# Patient Record
Sex: Female | Born: 1935
Health system: Southern US, Community
[De-identification: ages and names within clinical notes are randomized; demographics above are authoritative.]

## PROBLEM LIST (undated history)

## (undated) DIAGNOSIS — I2510 Atherosclerotic heart disease of native coronary artery without angina pectoris: Secondary | ICD-10-CM

## (undated) DIAGNOSIS — I10 Essential (primary) hypertension: Secondary | ICD-10-CM

## (undated) DIAGNOSIS — I219 Acute myocardial infarction, unspecified: Secondary | ICD-10-CM

## (undated) DIAGNOSIS — E119 Type 2 diabetes mellitus without complications: Secondary | ICD-10-CM

## (undated) DIAGNOSIS — E785 Hyperlipidemia, unspecified: Secondary | ICD-10-CM

## (undated) HISTORY — DX: Acute myocardial infarction, unspecified: I21.9

## (undated) HISTORY — DX: Type 2 diabetes mellitus without complications: E11.9

## (undated) HISTORY — DX: Essential (primary) hypertension: I10

## (undated) HISTORY — DX: Atherosclerotic heart disease of native coronary artery without angina pectoris: I25.10

## (undated) HISTORY — DX: Hyperlipidemia, unspecified: E78.5

---

## 1986-02-12 HISTORY — PX: APPENDECTOMY: SHX54

## 1988-02-13 HISTORY — PX: CORONARY ARTERY BYPASS GRAFT: SHX141

## 1995-02-13 LAB — HM DEXA SCAN: HM Dexa Scan: NORMAL

## 2010-02-12 HISTORY — PX: EYE SURGERY: SHX253

## 2013-02-12 HISTORY — PX: KYPHOPLASTY: SHX5884

## 2014-07-27 LAB — HEMOGLOBIN A1C: Hemoglobin A1C: 6.6

## 2014-07-27 LAB — LIPID PANEL
Cholesterol: 141 mg/dL (ref 0–200)
HDL: 52 mg/dL (ref 35–70)
LDL Cholesterol: 58 mg/dL

## 2014-07-27 LAB — HEPATIC FUNCTION PANEL
ALT: 15 U/L (ref 7–35)
AST: 17 U/L (ref 13–35)

## 2014-07-27 LAB — BASIC METABOLIC PANEL: CREATININE: 0.7 mg/dL (ref 0.5–1.1)

## 2015-01-25 LAB — HEMOGLOBIN A1C: Hemoglobin A1C: 6.9

## 2015-07-27 LAB — HEPATIC FUNCTION PANEL
ALT: 14 U/L (ref 7–35)
ALT: 14 U/L (ref 7–35)
AST: 14 U/L (ref 13–35)
AST: 14 U/L (ref 13–35)
Alkaline Phosphatase: 73 U/L (ref 25–125)
BILIRUBIN, TOTAL: 0.5 mg/dL

## 2015-07-27 LAB — CBC AND DIFFERENTIAL
HEMATOCRIT: 42 % (ref 36–46)
Hemoglobin: 14.2 g/dL (ref 12.0–16.0)
PLATELETS: 193 10*3/uL (ref 150–399)
WBC: 4.6 10^3/mL
WBC: 7.1 10^3/mL

## 2015-07-27 LAB — LIPID PANEL
CHOLESTEROL: 140 mg/dL (ref 0–200)
HDL: 60 mg/dL (ref 35–70)
LDL Cholesterol: 59 mg/dL
Triglycerides: 103 mg/dL (ref 40–160)

## 2015-07-27 LAB — BASIC METABOLIC PANEL
BUN: 18 mg/dL (ref 4–21)
CREATININE: 0.9 mg/dL (ref 0.5–1.1)
CREATININE: 0.9 mg/dL (ref 0.5–1.1)
GLUCOSE: 194 mg/dL
Potassium: 4.3 mmol/L (ref 3.4–5.3)
Sodium: 139 mmol/L (ref 137–147)

## 2015-07-27 LAB — HEMOGLOBIN A1C: Hemoglobin A1C: 7.2

## 2015-11-17 ENCOUNTER — Encounter: Payer: Self-pay | Admitting: Nurse Practitioner

## 2015-11-17 ENCOUNTER — Encounter: Payer: Self-pay | Admitting: *Deleted

## 2015-11-17 ENCOUNTER — Non-Acute Institutional Stay: Payer: Medicare Other | Admitting: Nurse Practitioner

## 2015-11-17 ENCOUNTER — Other Ambulatory Visit: Payer: Self-pay | Admitting: *Deleted

## 2015-11-17 VITALS — BP 120/82 | HR 79 | Temp 98.5°F | Resp 20 | Ht 65.0 in | Wt 149.6 lb

## 2015-11-17 DIAGNOSIS — I252 Old myocardial infarction: Secondary | ICD-10-CM

## 2015-11-17 DIAGNOSIS — N8110 Cystocele, unspecified: Secondary | ICD-10-CM | POA: Diagnosis not present

## 2015-11-17 DIAGNOSIS — E119 Type 2 diabetes mellitus without complications: Secondary | ICD-10-CM | POA: Insufficient documentation

## 2015-11-17 DIAGNOSIS — E1100 Type 2 diabetes mellitus with hyperosmolarity without nonketotic hyperglycemic-hyperosmolar coma (NKHHC): Secondary | ICD-10-CM

## 2015-11-17 DIAGNOSIS — I10 Essential (primary) hypertension: Secondary | ICD-10-CM | POA: Diagnosis not present

## 2015-11-17 MED ORDER — METFORMIN HCL 500 MG PO TABS
500.0000 mg | ORAL_TABLET | Freq: Two times a day (BID) | ORAL | 2 refills | Status: DC
Start: 2015-11-17 — End: 2016-08-23

## 2015-11-17 MED ORDER — CALCIUM CARBONATE-VITAMIN D3 600-400 MG-UNIT PO TABS
600.0000 mg | ORAL_TABLET | Freq: Two times a day (BID) | ORAL | 2 refills | Status: AC
Start: 2015-11-17 — End: ?

## 2015-11-17 MED ORDER — ATORVASTATIN CALCIUM 20 MG PO TABS: 20.0000 mg | ORAL_TABLET | Freq: Every day | ORAL | 2 refills | Status: DC

## 2015-11-17 MED ORDER — ATENOLOL 50 MG PO TABS: 50.0000 mg | ORAL_TABLET | Freq: Every day | ORAL | 2 refills | Status: DC

## 2015-11-17 MED ORDER — ENALAPRIL MALEATE 20 MG PO TABS
20.0000 mg | ORAL_TABLET | Freq: Two times a day (BID) | ORAL | 2 refills | Status: DC
Start: 2015-11-17 — End: 2016-08-23

## 2015-11-17 MED ORDER — HYDROCHLOROTHIAZIDE 25 MG PO TABS: 25.0000 mg | ORAL_TABLET | Freq: Every day | ORAL | 2 refills | Status: DC

## 2015-11-17 NOTE — Assessment & Plan Note (Addendum)
Hx of Hgb a1c 6-7, Metformin 500mg  bid, Hgb a1c CBC CMP TSH lipid panel.

## 2015-11-17 NOTE — Assessment & Plan Note (Signed)
Cystocele, pessary, Urology.

## 2015-11-17 NOTE — Progress Notes (Signed)
Location:   [facility]   Place of Service:  Clinic (12)   Provider: Marlana Latus NP  Code Status: DNR Goals of Care: [place]  Advanced Directives 11/17/2015  Does patient have an advance directive? Yes  Type of Paramedic of [place];Living will  Does patient want to make changes to advanced directive? No - Patient declined  Copy of advanced directive(s) in chart? Yes     Chief Complaint  Patient presents with  . Establish Care    HPI: Patient is a 82 y.o. female seen today for new patient establishment. Hx of HTN, controlled on Atenolol, Enalapril, HCTZ, cystocele, pessary, needs a Urologist in the area, urinate 1-2x/night, T2DM, controlled on Metformin 500mg  bid, Hgb a1c was 6-7 in the past. Hx of heart attack 28 years ago, no stent or bypass, she was subsequently lost 40Ibs, taking ASA and statin, no further chest pain/pressure since then.   Past Medical History:  Diagnosis Date  . Diabetes mellitus without complication (HCC)    Type 2   . Hyperlipidemia   . Hypertension     Past Surgical History:  Procedure Laterality Date  . APPENDECTOMY  1988   Dr. Collier Salina  . CORONARY ARTERY BYPASS GRAFT  1990  . EYE SURGERY  2012   Dr, Vickki Muff  . KYPHOPLASTY  2015   Dr. Donivan Scull    No Known Allergies    Medication List       Accurate as of 11/17/15  4:04 PM. Always use your most recent med list.          aspirin EC 81 MG tablet Take 81 mg by mouth daily.   atenolol 50 MG tablet Commonly known as:  TENORMIN Take 50 mg by mouth daily.   atorvastatin 20 MG tablet Commonly known as:  LIPITOR Take 20 mg by mouth daily.   Calcium Carbonate-Vitamin D3 600-400 MG-UNIT Tabs Take 600 mg by mouth 2 (two) times daily with a meal.   enalapril 20 MG tablet Commonly known as:  VASOTEC Take 20 mg by mouth 2 (two) times daily.   hydrochlorothiazide 25 MG tablet Commonly known as:  HYDRODIURIL Take 25 mg by mouth daily.   metFORMIN 500 MG tablet Commonly  known as:  GLUCOPHAGE Take 500 mg by mouth 2 (two) times daily with a meal.       Review of Systems:  Review of Systems  Constitutional: Negative for activity change, appetite change, chills, diaphoresis, fever and unexpected weight change.  HENT: Negative for congestion, dental problem, ear discharge, ear pain, hearing loss, mouth sores, rhinorrhea and trouble swallowing.   Eyes: Negative for photophobia, pain, discharge, redness, itching and visual disturbance.  Respiratory: Negative for apnea, cough, choking, chest tightness, shortness of breath and wheezing.   Cardiovascular: Negative for chest pain, palpitations and leg swelling.  Gastrointestinal: Negative for abdominal distention, abdominal pain, anal bleeding, blood in stool, constipation, diarrhea and nausea.  Genitourinary: Positive for frequency. Negative for dysuria, flank pain, genital sores, pelvic pain and urgency.       Pessary  Musculoskeletal: Negative for arthralgias, back pain, gait problem, joint swelling, myalgias, neck pain and neck stiffness.  Skin: Negative for color change, pallor and rash.  Allergic/Immunologic: Negative.   Neurological: Negative for dizziness, tremors, seizures, syncope, facial asymmetry, speech difficulty, light-headedness and numbness.  Hematological: Negative.   Psychiatric/Behavioral: Negative for agitation, behavioral problems, confusion, decreased concentration, dysphoric mood and hallucinations. The patient is not nervous/anxious and is not hyperactive.  Health Maintenance  Topic Date Due  . HEMOGLOBIN A1C  08-29-25  . FOOT EXAM  07[birth year]09/1933  . OPHTHALMOLOGY EXAM  07/09/1935  . TETANUS/TDAP  07/08/1944  . ZOSTAVAX  07/08/1985  . DEXA SCAN  07/09/1990  . PNA vac Low Risk Adult (1 of 2 - PCV13) 07/09/1990  . INFLUENZA VACCINE  09/13/2015    Physical Exam: Vitals:   11/17/15 1514  BP: 120/82  Pulse: 79  Resp: 20  Temp: 98.5 F (36.9 C)  Weight: 149 lb 9.6 oz (67.9 kg)    Height: 5\' 5"  (1.651 m)   Body mass index is 24.89 kg/m. Physical Exam  Constitutional: She is oriented to person, place, and time. She appears well-developed and well-nourished.  HENT:  Head: Normocephalic and atraumatic.  Right Ear: External ear normal.  Left Ear: External ear normal.  Nose: Nose normal.  Mouth/Throat: Oropharynx is clear and moist.  Eyes: Conjunctivae and EOM are normal. Pupils are equal, round, and reactive to light. Right eye exhibits no discharge. Left eye exhibits no discharge.  S/p cataracts surgery R+L, artificial lens  Neck: Normal range of motion. Neck supple. No JVD present. No tracheal deviation present. No thyromegaly present.  Cardiovascular: Normal rate, regular rhythm, normal heart sounds and intact distal pulses.  Exam reveals no friction rub.   No murmur heard. Pulmonary/Chest: Effort normal and breath sounds normal. No stridor. No respiratory distress. She has no wheezes. She has no rales. She exhibits no tenderness.  Abdominal: Soft. Bowel sounds are normal. There is no tenderness. There is no rebound and no guarding.  Genitourinary: Vagina normal. Rectal exam shows guaiac negative stool. No vaginal discharge found.  Genitourinary Comments: pessary  Musculoskeletal: Normal range of motion. She exhibits no edema, tenderness or deformity.  Lymphadenopathy:    She has no cervical adenopathy.  Neurological: She is alert and oriented to person, place, and time. She has normal reflexes. She displays normal reflexes. No cranial nerve deficit. She exhibits normal muscle tone. Coordination normal.  Skin: Skin is warm and dry. No rash noted. No erythema. No pallor.  Psychiatric: She has a normal mood and affect. Her behavior is normal. Judgment and thought content normal.    Labs reviewed: Basic Metabolic Panel: No results for input(s): NA, K, CL, CO2, GLUCOSE, BUN, CREATININE, CALCIUM, MG, PHOS, TSH in the last 8760 hours. Liver Function Tests: No  results for input(s): AST, ALT, ALKPHOS, BILITOT, PROT, ALBUMIN in the last 8760 hours. No results for input(s): LIPASE, AMYLASE in the last 8760 hours. No results for input(s): AMMONIA in the last 8760 hours. CBC: No results for input(s): WBC, NEUTROABS, HGB, HCT, MCV, PLT in the last 8760 hours. Lipid Panel: No results for input(s): CHOL, HDL, LDLCALC, TRIG, CHOLHDL, LDLDIRECT in the last 8760 hours. No results found for: HGBA1C  Procedures since last visit: No results found.  Assessment/Plan Type 2 diabetes mellitus (HCC) Hx of Hgb a1c 6-7, Metformin 500mg  bid, Hgb a1c CBC CMP TSH lipid panel.   HTN (hypertension) Controlled, continue Enalapril 20mg  bid, Atenolol 50mg , HCTZ 25mg   History of heart attack 28 years ago, one artery complete blockage, no stent, no bypass, lost 40Ibs since then, no further chest pain or pressure. Taking ASA and Statin.   Cystocele, unspecified (CODE) Cystocele, pessary, Urology.     Labs/tests ordered:  CBC, CMP, TSH, Hgb A1c, lipid panel.   Next appt:  3 months

## 2015-11-17 NOTE — Assessment & Plan Note (Signed)
28 years ago, one artery complete blockage, no stent, no bypass, lost 40Ibs since then, no further chest pain or pressure. Taking ASA and Statin.

## 2015-11-17 NOTE — Assessment & Plan Note (Signed)
Controlled, continue Enalapril 20mg  bid, Atenolol 50mg , HCTZ 25mg 

## 2016-01-18 DIAGNOSIS — N8111 Cystocele, midline: Secondary | ICD-10-CM | POA: Diagnosis not present

## 2016-02-23 ENCOUNTER — Encounter: Payer: Self-pay | Admitting: Nurse Practitioner

## 2016-02-23 ENCOUNTER — Non-Acute Institutional Stay: Payer: PPO | Admitting: Nurse Practitioner

## 2016-02-23 DIAGNOSIS — E1100 Type 2 diabetes mellitus with hyperosmolarity without nonketotic hyperglycemic-hyperosmolar coma (NKHHC): Secondary | ICD-10-CM | POA: Diagnosis not present

## 2016-02-23 DIAGNOSIS — S32020A Wedge compression fracture of second lumbar vertebra, initial encounter for closed fracture: Secondary | ICD-10-CM | POA: Insufficient documentation

## 2016-02-23 DIAGNOSIS — S32020S Wedge compression fracture of second lumbar vertebra, sequela: Secondary | ICD-10-CM | POA: Diagnosis not present

## 2016-02-23 DIAGNOSIS — I10 Essential (primary) hypertension: Secondary | ICD-10-CM

## 2016-02-23 DIAGNOSIS — I252 Old myocardial infarction: Secondary | ICD-10-CM

## 2016-02-23 DIAGNOSIS — N8110 Cystocele, unspecified: Secondary | ICD-10-CM

## 2016-02-23 DIAGNOSIS — E785 Hyperlipidemia, unspecified: Secondary | ICD-10-CM | POA: Diagnosis not present

## 2016-02-23 NOTE — Assessment & Plan Note (Signed)
07/27/15 wbc 7.1, Hgb 14.2, plt 193, Na 139, K 4.3, Bun 18, creat 0.90, Hgb a1c7.2, LDL 59.

## 2016-02-23 NOTE — Progress Notes (Signed)
Location:   [facility]   Place of Service:  Clinic (12)   Provider: Marlana Latus NP  Code Status: DNR [place] of Care: [place]  [place]ives 11/17/2015  Does Patient Have a Medical Advance Directive? Yes  Type of Paramedic of [place];Living will  Does patient want to make changes to medical advance directive? No - Patient declined  Copy of [place] in Chart? Yes     Chief Complaint  Patient presents with  . Medical Management of Chronic Issues    HPI: Patient is a 83 y.o. female seen today for new patient establishment. Hx of HTN, controlled on Atenolol, Enalapril, HCTZ, cystocele, pessary, needs a Urologist in the area, urinate 1-2x/night, T2DM, controlled on Metformin 500mg  bid, Hgb a1c was 6-7 in the past. Hx of heart attack 28 years ago, no stent or bypass, she was subsequently lost 40Ibs, taking ASA and statin, no further chest pain/pressure since then.   Past Medical History:  Diagnosis Date  . Diabetes mellitus without complication (HCC)    Type 2   . Hyperlipidemia   . Hypertension     Past Surgical History:  Procedure Laterality Date  . APPENDECTOMY  1988   Dr. Collier Salina  . CORONARY ARTERY BYPASS GRAFT  1990  . EYE SURGERY  2012   Dr, Vickki Muff  . KYPHOPLASTY  2015   Dr. Donivan Scull    No Known Allergies  Allergies as of 02/23/2016   No Known Allergies     Medication List       Accurate as of 02/23/16  3:00 PM. Always use your most recent med list.          aspirin EC 81 MG tablet Take 81 mg by mouth daily.   atenolol 50 MG tablet Commonly known as:  TENORMIN Take 1 tablet (50 mg total) by mouth daily.   atorvastatin 20 MG tablet Commonly known as:  LIPITOR Take 1 tablet (20 mg total) by mouth daily.   Calcium Carbonate-Vitamin D3 600-400 MG-UNIT Tabs Take 600 mg by mouth 2 (two) times daily with a meal.   enalapril 20 MG tablet Commonly known as:  VASOTEC Take 1 tablet (20 mg total) by mouth 2 (two) times  daily.   hydrochlorothiazide 25 MG tablet Commonly known as:  HYDRODIURIL Take 1 tablet (25 mg total) by mouth daily.   metFORMIN 500 MG tablet Commonly known as:  GLUCOPHAGE Take 1 tablet (500 mg total) by mouth 2 (two) times daily with a meal.       Review of Systems:  Review of Systems  Constitutional: Negative for activity change, appetite change, chills, diaphoresis, fever and unexpected weight change.  HENT: Negative for congestion, dental problem, ear discharge, ear pain, hearing loss, mouth sores, rhinorrhea and trouble swallowing.   Eyes: Negative for photophobia, pain, discharge, redness, itching and visual disturbance.  Respiratory: Negative for apnea, cough, choking, chest tightness, shortness of breath and wheezing.   Cardiovascular: Negative for chest pain, palpitations and leg swelling.  Gastrointestinal: Negative for abdominal distention, abdominal pain, anal bleeding, blood in stool, constipation, diarrhea and nausea.  Genitourinary: Positive for frequency. Negative for dysuria, flank pain, genital sores, pelvic pain and urgency.       Pessary  Musculoskeletal: Negative for arthralgias, back pain, gait problem, joint swelling, myalgias, neck pain and neck stiffness.  Skin: Negative for color change, pallor and rash.  Allergic/Immunologic: Negative.   Neurological: Negative for dizziness, tremors, seizures, syncope, facial asymmetry, speech difficulty, light-headedness  and numbness.  Hematological: Negative.   Psychiatric/Behavioral: Negative for agitation, behavioral problems, confusion, decreased concentration, dysphoric mood and hallucinations. The patient is not nervous/anxious and is not hyperactive.     Health Maintenance  Topic Date Due  . HEMOGLOBIN A1C  1925/12/19  . FOOT EXAM  07/09[birth year]1935  . OPHTHALMOLOGY EXAM  07/09/1935  . TETANUS/TDAP  07/08/1944  . ZOSTAVAX  07/08/1985  . DEXA SCAN  07/09/1990  . PNA vac Low Risk Adult (1 of 2 - PCV13) 07/09/1990    . INFLUENZA VACCINE  09/13/2015    Physical Exam: Vitals:   02/23/16 1427  BP: 130/80  Pulse: 73  Resp: 18  Temp: 98.6 F (37 C)  TempSrc: Oral  SpO2: 96%  Weight: 151 lb 9.6 oz (68.8 kg)  Height: 5\' 5"  (1.651 m)   Body mass index is 25.23 kg/m. Physical Exam  Constitutional: She is oriented to person, place, and time. She appears well-developed and well-nourished.  HENT:  Head: Normocephalic and atraumatic.  Right Ear: External ear normal.  Left Ear: External ear normal.  Nose: Nose normal.  Mouth/Throat: Oropharynx is clear and moist.  Eyes: Conjunctivae and EOM are normal. Pupils are equal, round, and reactive to light. Right eye exhibits no discharge. Left eye exhibits no discharge.  S/p cataracts surgery R+L, artificial lens  Neck: Normal range of motion. Neck supple. No JVD present. No tracheal deviation present. No thyromegaly present.  Cardiovascular: Normal rate, regular rhythm, normal heart sounds and intact distal pulses.  Exam reveals no friction rub.   No murmur heard. Pulmonary/Chest: Effort normal and breath sounds normal. No stridor. No respiratory distress. She has no wheezes. She has no rales. She exhibits no tenderness.  Abdominal: Soft. Bowel sounds are normal. There is no tenderness. There is no rebound and no guarding.  Genitourinary: Vagina normal. Rectal exam shows guaiac negative stool. No vaginal discharge found.  Genitourinary Comments: pessary  Musculoskeletal: Normal range of motion. She exhibits no edema, tenderness or deformity.  Lymphadenopathy:    She has no cervical adenopathy.  Neurological: She is alert and oriented to person, place, and time. She has normal reflexes. No cranial nerve deficit. She exhibits normal muscle tone. Coordination normal.  Skin: Skin is warm and dry. No rash noted. No erythema. No pallor.  Psychiatric: She has a normal mood and affect. Her behavior is normal. Judgment and thought content normal.    Labs  reviewed: Basic Metabolic Panel: No results for input(s): NA, K, CL, CO2, GLUCOSE, BUN, CREATININE, CALCIUM, MG, PHOS, TSH in the last 8760 hours. Liver Function Tests: No results for input(s): AST, ALT, ALKPHOS, BILITOT, PROT, ALBUMIN in the last 8760 hours. No results for input(s): LIPASE, AMYLASE in the last 8760 hours. No results for input(s): AMMONIA in the last 8760 hours. CBC: No results for input(s): WBC, NEUTROABS, HGB, HCT, MCV, PLT in the last 8760 hours. Lipid Panel: No results for input(s): CHOL, HDL, LDLCALC, TRIG, CHOLHDL, LDLDIRECT in the last 8760 hours. No results found for: HGBA1C  Procedures since last visit: No results found.  Assessment/Plan HTN (hypertension) Controlled, continue Atenolol 50mg  daily, Enalapril 20mg  daily, HCT 25mg  daily. 07/27/15 wbc 7.1, Hgb 14.2, plt 193, Na 139, K 4.3, Bun 18, creat 0.90, Hgb a1c7.2, LDL 59.   Type 2 diabetes mellitus (HCC) Takes Metformin, will update CBC CMP TSH Hgb a1c lipid panel  History of heart attack 07/27/15 wbc 7.1, Hgb 14.2, plt 193, Na 139, K 4.3, Bun 18, creat 0.90, Hgb a1c7.2, LDL 59.  Cystocele, unspecified (CODE) Midline, pessary, urology q 6 months, last month was last visit, urinary leakage.   Compression fracture of L2 (HCC) Hx, s/p fixation kyphoplasty  Hyperlipidemia Taking Atorvastatin 20mg      Labs/tests ordered:  CBC, CMP, TSH, Hgb A1c, lipid panel prior to next appoint  Next appt:  6 months.

## 2016-02-23 NOTE — Assessment & Plan Note (Signed)
Taking Atorvastatin 20mg 

## 2016-02-23 NOTE — Assessment & Plan Note (Signed)
Takes Metformin, will update CBC CMP TSH Hgb a1c lipid panel

## 2016-02-23 NOTE — Assessment & Plan Note (Signed)
Hx, s/p fixation kyphoplasty

## 2016-02-23 NOTE — Assessment & Plan Note (Signed)
Controlled, continue Atenolol 50mg  daily, Enalapril 20mg  daily, HCT 25mg  daily. 07/27/15 wbc 7.1, Hgb 14.2, plt 193, Na 139, K 4.3, Bun 18, creat 0.90, Hgb a1c7.2, LDL 59.

## 2016-02-23 NOTE — Assessment & Plan Note (Addendum)
Midline, pessary, urology q 6 months, last month was last visit, urinary leakage.

## 2016-02-27 ENCOUNTER — Other Ambulatory Visit: Payer: Self-pay | Admitting: *Deleted

## 2016-02-27 DIAGNOSIS — I10 Essential (primary) hypertension: Secondary | ICD-10-CM

## 2016-02-27 DIAGNOSIS — E119 Type 2 diabetes mellitus without complications: Secondary | ICD-10-CM

## 2016-02-27 DIAGNOSIS — E785 Hyperlipidemia, unspecified: Secondary | ICD-10-CM

## 2016-05-04 DIAGNOSIS — E119 Type 2 diabetes mellitus without complications: Secondary | ICD-10-CM | POA: Diagnosis not present

## 2016-05-31 ENCOUNTER — Encounter: Payer: Self-pay | Admitting: Internal Medicine

## 2016-07-23 DIAGNOSIS — N8111 Cystocele, midline: Secondary | ICD-10-CM | POA: Diagnosis not present

## 2016-07-23 DIAGNOSIS — N952 Postmenopausal atrophic vaginitis: Secondary | ICD-10-CM | POA: Diagnosis not present

## 2016-08-16 ENCOUNTER — Other Ambulatory Visit: Payer: PPO

## 2016-08-16 DIAGNOSIS — E119 Type 2 diabetes mellitus without complications: Secondary | ICD-10-CM | POA: Diagnosis not present

## 2016-08-16 DIAGNOSIS — I10 Essential (primary) hypertension: Secondary | ICD-10-CM | POA: Diagnosis not present

## 2016-08-16 DIAGNOSIS — I2510 Atherosclerotic heart disease of native coronary artery without angina pectoris: Secondary | ICD-10-CM | POA: Diagnosis not present

## 2016-08-16 DIAGNOSIS — E785 Hyperlipidemia, unspecified: Secondary | ICD-10-CM | POA: Diagnosis not present

## 2016-08-23 ENCOUNTER — Non-Acute Institutional Stay: Payer: PPO | Admitting: Nurse Practitioner

## 2016-08-23 ENCOUNTER — Encounter: Payer: Self-pay | Admitting: Nurse Practitioner

## 2016-08-23 DIAGNOSIS — E1100 Type 2 diabetes mellitus with hyperosmolarity without nonketotic hyperglycemic-hyperosmolar coma (NKHHC): Secondary | ICD-10-CM

## 2016-08-23 DIAGNOSIS — N8110 Cystocele, unspecified: Secondary | ICD-10-CM

## 2016-08-23 DIAGNOSIS — E785 Hyperlipidemia, unspecified: Secondary | ICD-10-CM

## 2016-08-23 DIAGNOSIS — I252 Old myocardial infarction: Secondary | ICD-10-CM | POA: Diagnosis not present

## 2016-08-23 DIAGNOSIS — I10 Essential (primary) hypertension: Secondary | ICD-10-CM | POA: Diagnosis not present

## 2016-08-23 NOTE — Progress Notes (Signed)
Location:   [facility]   Place of Service:  Clinic (12)   Provider: Marlana Latus NP  Code Status: DNR [place]als of Care: [place]  [place] Directives 08/23/2016  Does Patient Have a Medical Advance Directive? Yes  Type of Advance Directive [place]  Does patient want to make changes to medical advance directive? No - Patient declined  Copy of [place] in Chart? -     Chief Complaint  Patient presents with  . Medical Management of Chronic Issues    6 mo f/u with labs    HPI: Patient is a 83 y.o. female seen today for managing her chronic medical condisitons.   Hx of HTN, controlled on Atenolol, Enalapril, HCTZ, cystocele, pessary, needs a Urologist in the area, urinate 1-2x/night, T2DM, controlled on Metformin 500mg  bid, Hgb a1c was 6-7 in the past. Hx of heart attack 30 years ago, no stent or bypass, she was subsequently lost 40Ibs, taking ASA and statin, no further chest pain/pressure since then, weight has been stable.   Past Medical History:  Diagnosis Date  . Coronary arteriosclerosis   . Diabetes mellitus without complication (HCC)    Type 2   . Hyperlipidemia   . Hypertension   . Myocardial infarction [facility])     Past Surgical History:  Procedure Laterality Date  . APPENDECTOMY  1988   Dr. Collier Salina  . CORONARY ARTERY BYPASS GRAFT  1990  . EYE SURGERY  2012   Dr, Vickki Muff  . KYPHOPLASTY  2015   Dr. Donivan Scull    No Known Allergies  Allergies as of 08/23/2016   No Known Allergies     Medication List       Accurate as of 08/23/16  2:11 PM. Always use your most recent med list.          aspirin EC 81 MG tablet Take 81 mg by mouth daily.   atenolol 50 MG tablet Commonly known as:  TENORMIN Take 1 tablet (50 mg total) by mouth daily.   atorvastatin 20 MG tablet Commonly known as:  LIPITOR Take 1 tablet (20 mg total) by mouth daily.   Calcium Carbonate-Vitamin D3 600-400 MG-UNIT Tabs Take 600 mg by mouth 2 (two) times daily with a  meal.   enalapril 20 MG tablet Commonly known as:  VASOTEC Take 1 tablet (20 mg total) by mouth 2 (two) times daily.   hydrochlorothiazide 25 MG tablet Commonly known as:  HYDRODIURIL Take 1 tablet (25 mg total) by mouth daily.   metFORMIN 500 MG tablet Commonly known as:  GLUCOPHAGE Take 1 tablet (500 mg total) by mouth 2 (two) times daily with a meal.       Review of Systems:  Review of Systems  Constitutional: Negative for activity change, appetite change, chills, diaphoresis, fever and unexpected weight change.  HENT: Negative for congestion, dental problem, ear discharge, ear pain, hearing loss, mouth sores, rhinorrhea and trouble swallowing.   Eyes: Negative for photophobia, pain, discharge, redness, itching and visual disturbance.  Respiratory: Negative for apnea, cough, choking, chest tightness, shortness of breath and wheezing.   Cardiovascular: Negative for chest pain, palpitations and leg swelling.  Gastrointestinal: Negative for abdominal distention, abdominal pain, anal bleeding, blood in stool, constipation, diarrhea and nausea.  Genitourinary: Positive for frequency. Negative for dysuria, flank pain, genital sores, pelvic pain and urgency.       Pessary, urination 1-2x/night.   Musculoskeletal: Negative for arthralgias, back pain, gait problem, joint swelling, myalgias, neck pain  and neck stiffness.  Skin: Negative for color change, pallor and rash.  Allergic/Immunologic: Negative.   Neurological: Negative for dizziness, tremors, seizures, syncope, facial asymmetry, speech difficulty, light-headedness and numbness.  Hematological: Negative.   Psychiatric/Behavioral: Negative for agitation, behavioral problems, confusion, decreased concentration, dysphoric mood and hallucinations. The patient is not nervous/anxious and is not hyperactive.        Sometimes not returning to sleep after bathroom trips, but she felt rested anyway    Health Maintenance  Topic Date Due    . FOOT EXAM  [birth year]09/1933  . OPHTHALMOLOGY EXAM  07/09/1935  . TETANUS/TDAP  07/08/1944  . DEXA SCAN  07/09/1990  . PNA vac Low Risk Adult (1 of 2 - PCV13) 07/09/1990  . HEMOGLOBIN A1C  01/26/2016  . INFLUENZA VACCINE  09/12/2016    Physical Exam: Vitals:   08/23/16 1333  BP: 130/82  Pulse: 74  Resp: 20  Temp: 98.2 F (36.8 C)  SpO2: 95%  Weight: 153 lb 6.4 oz (69.6 kg)  Height: 5\' 5"  (1.651 m)   Body mass index is 25.53 kg/m. Physical Exam  Constitutional: She is oriented to person, place, and time. She appears well-developed and well-nourished.  HENT:  Head: Normocephalic and atraumatic.  Right Ear: External ear normal.  Left Ear: External ear normal.  Nose: Nose normal.  Mouth/Throat: Oropharynx is clear and moist.  Eyes: Pupils are equal, round, and reactive to light. Conjunctivae and EOM are normal. Right eye exhibits no discharge. Left eye exhibits no discharge.  S/p cataracts surgery R+L, artificial lens  Neck: Normal range of motion. Neck supple. No JVD present. No tracheal deviation present. No thyromegaly present.  Cardiovascular: Normal rate, regular rhythm, normal heart sounds and intact distal pulses.  Exam reveals no friction rub.   No murmur heard. Pulmonary/Chest: Effort normal and breath sounds normal. No stridor. No respiratory distress. She has no wheezes. She has no rales. She exhibits no tenderness.  Abdominal: Soft. Bowel sounds are normal. There is no tenderness. There is no rebound and no guarding.  Genitourinary: Vagina normal. Rectal exam shows guaiac negative stool. No vaginal discharge found.  Genitourinary Comments: pessary  Musculoskeletal: Normal range of motion. She exhibits no edema, tenderness or deformity.  Lymphadenopathy:    She has no cervical adenopathy.  Neurological: She is alert and oriented to person, place, and time. She has normal reflexes. No cranial nerve deficit. She exhibits normal muscle tone. Coordination normal.  Skin:  Skin is warm and dry. No rash noted. No erythema. No pallor.  Psychiatric: She has a normal mood and affect. Her behavior is normal. Judgment and thought content normal.    Labs reviewed: Basic Metabolic Panel: No results for input(s): NA, K, CL, CO2, GLUCOSE, BUN, CREATININE, CALCIUM, MG, PHOS, TSH in the last 8760 hours. Liver Function Tests: No results for input(s): AST, ALT, ALKPHOS, BILITOT, PROT, ALBUMIN in the last 8760 hours. No results for input(s): LIPASE, AMYLASE in the last 8760 hours. No results for input(s): AMMONIA in the last 8760 hours. CBC: No results for input(s): WBC, NEUTROABS, HGB, HCT, MCV, PLT in the last 8760 hours. Lipid Panel: No results for input(s): CHOL, HDL, LDLCALC, TRIG, CHOLHDL, LDLDIRECT in the last 8760 hours. Lab Results  Component Value Date   HGBA1C 7.2 07/27/2015    Procedures since last visit: No results found.  Assessment/Plan Type 2 diabetes mellitus (HCC) Denied tingling, numbness, or pain in BLE/feet, 08/17/16 Hgb a1c 6.8(7.2 07/26/16), cholesterol 123, HDL 57, LDL 46 08/23/16 continue Metformin  500mg  bid po, Atorvastatin 20mg  qd  HTN (hypertension) Controlled, continue Atenolol 50mg  daily, Enalapril 20mg  daily, HCT 25mg  daily, ASA 81mg  qd  Hyperlipidemia Stable, LDL 46 08/17/16, continue Atorvastatin 20mg  qd  Cystocele, unspecified (CODE) Continue Pessary, 1-2x urination/night  History of heart attack No angina since last seen, continue Atorvastatin,  ASA, Atenolol, Enalapril.     Labs/tests ordered: CBC CMP Hgb a1c prior to next appointment.   Next appt:  1:30 pm, 02/28/2017

## 2016-08-23 NOTE — Assessment & Plan Note (Addendum)
Denied tingling, numbness, or pain in BLE/feet, 08/17/16 Hgb a1c 6.8(7.2 07/26/16), cholesterol 123, HDL 57, LDL 46 08/23/16 continue Metformin 500mg  bid po, Atorvastatin 20mg  qd

## 2016-08-23 NOTE — Assessment & Plan Note (Signed)
Stable, LDL 46 08/17/16, continue Atorvastatin 20mg  qd

## 2016-08-23 NOTE — Assessment & Plan Note (Signed)
Continue Pessary, 1-2x urination/night

## 2016-08-23 NOTE — Patient Instructions (Signed)
CBC CMP Hgb a1c prior to next appointment. Next appt:  1:30 pm, 02/28/2017

## 2016-08-23 NOTE — Assessment & Plan Note (Signed)
No angina since last seen, continue Atorvastatin,  ASA, Atenolol, Enalapril.

## 2016-08-23 NOTE — Assessment & Plan Note (Addendum)
Controlled, continue Atenolol 50mg  daily, Enalapril 20mg  daily, HCT 25mg  daily, ASA 81mg  qd

## 2016-08-27 MED ORDER — ENALAPRIL MALEATE 20 MG PO TABS: 20.0000 mg | ORAL_TABLET | Freq: Two times a day (BID) | ORAL | 2 refills | Status: DC

## 2016-08-27 MED ORDER — ATORVASTATIN CALCIUM 20 MG PO TABS: 20.0000 mg | ORAL_TABLET | Freq: Every day | ORAL | 2 refills | Status: DC

## 2016-08-27 MED ORDER — METFORMIN HCL 500 MG PO TABS: 500.0000 mg | ORAL_TABLET | Freq: Two times a day (BID) | ORAL | 2 refills | Status: DC

## 2016-08-27 MED ORDER — ATENOLOL 50 MG PO TABS: 50.0000 mg | ORAL_TABLET | Freq: Every day | ORAL | 2 refills | Status: DC

## 2016-08-27 MED ORDER — HYDROCHLOROTHIAZIDE 25 MG PO TABS: 25.0000 mg | ORAL_TABLET | Freq: Every day | ORAL | 2 refills | Status: DC

## 2016-12-28 ENCOUNTER — Telehealth: Payer: Self-pay | Admitting: Internal Medicine

## 2016-12-28 NOTE — Telephone Encounter (Signed)
I called the patient to schedule AWV-I at [facility] clinic on 01/11/17.  Pt will not be there on that date, so I will call her back once I know of another date Clarise Cruz will be at [facility]. Pt declined coming to [facility] office on [facility]. for appt. VDM (DD)

## 2017-01-10 ENCOUNTER — Non-Acute Institutional Stay: Payer: PPO

## 2017-01-10 VITALS — BP 135/70 | HR 70 | Temp 98.9°F | Ht 65.0 in | Wt 156.0 lb

## 2017-01-10 DIAGNOSIS — Z0000 Encounter for general adult medical examination without abnormal findings: Secondary | ICD-10-CM

## 2017-01-10 NOTE — Progress Notes (Signed)
Subjective:   Jessica Walters is a 83 y.o. female who presents for an Initial Medicare Annual Wellness Visit at [facility]       Objective:    Today's Vitals   01/10/17 1103  BP: 135/70  Pulse: 70  Temp: 98.9 F (37.2 C)  TempSrc: Oral  SpO2: 95%  Weight: 156 lb (70.8 kg)  Height: 5\' 5"  (1.651 m)   Body mass index is 25.96 kg/m.   Current Medications (verified) Outpatient Encounter Medications as of 01/10/2017  Medication Sig  . aspirin EC 81 MG tablet Take 81 mg by mouth daily.  Marland Kitchen atenolol (TENORMIN) 50 MG tablet Take 1 tablet (50 mg total) by mouth daily.  Marland Kitchen atorvastatin (LIPITOR) 20 MG tablet Take 1 tablet (20 mg total) by mouth daily.  . Calcium Carbonate-Vitamin D3 600-400 MG-UNIT TABS Take 600 mg by mouth 2 (two) times daily with a meal.  . enalapril (VASOTEC) 20 MG tablet Take 1 tablet (20 mg total) by mouth 2 (two) times daily.  . hydrochlorothiazide (HYDRODIURIL) 25 MG tablet Take 1 tablet (25 mg total) by mouth daily.  . metFORMIN (GLUCOPHAGE) 500 MG tablet Take 1 tablet (500 mg total) by mouth 2 (two) times daily with a meal.   No facility-administered encounter medications on file as of 01/10/2017.     Allergies (verified) Patient has no known allergies.   History: Past Medical History:  Diagnosis Date  . Coronary arteriosclerosis   . Diabetes mellitus without complication (HCC)    Type 2   . Hyperlipidemia   . Hypertension   . Myocardial infarction [facility])    Past Surgical History:  Procedure Laterality Date  . APPENDECTOMY  1988   Dr. Collier Salina  . CORONARY ARTERY BYPASS GRAFT  1990  . EYE SURGERY  2012   Dr, Vickki Muff  . KYPHOPLASTY  2015   Dr. Donivan Scull   Family History  Problem Relation Age of Onset  . Hypertension Mother   . Lung cancer Father    Social History   Occupational History  . Not on file  Tobacco Use  . Smoking status: Never Smoker  . Smokeless tobacco: Never Used  Substance and Sexual Activity    . Alcohol use: Yes    Comment: 2-3 weekly  . Drug use: Not on file  . Sexual activity: Not on file    Tobacco Counseling Counseling given: Not Answered   Activities of Daily Living In your present state of health, do you have any difficulty performing the following activities: 01/10/2017  Hearing? N  Vision? N  Difficulty concentrating or making decisions? N  Walking or climbing stairs? N  Dressing or bathing? N  Doing errands, shopping? N  Preparing Food and eating ? N  Using the Toilet? N  In the past six months, have you accidently leaked urine? Y  Comment pessary  Do you have problems with loss of bowel control? N  Managing your Medications? N  Managing your Finances? N  Housekeeping or managing your Housekeeping? N  Some recent data might be hidden    Immunizations and Health Maintenance Immunization History  Administered Date(s) Administered  . Influenza, Seasonal, Injecte, Preservative Fre 12/27/2010, 12/21/2011  . Influenza,inj,Quad PF,6+ Mos 01/14/2013, 12/01/2013, 01/25/2015  . Pneumococcal Conjugate-13 07/27/2014  . Pneumococcal Polysaccharide-23 11/07/2004   Health Maintenance Due  Topic Date Due  . FOOT EXAM  07/09/1933  . OPHTHALMOLOGY EXAM  07/09/1935  . TETANUS/TDAP  07/08/1944  . DEXA SCAN  07/09/1990  .  HEMOGLOBIN A1C  01/26/2016  . INFLUENZA VACCINE  09/12/2016    Patient Care Team: Blanchie Serve, MD as PCP - General (Internal Medicine) Mast, Man X, NP as Nurse Practitioner (Internal Medicine)  Indicate any recent Medical Services you may have received from other than [facility] providers in the past year (date may be approximate).     Assessment:   This is a routine wellness examination for Jessica Walters.   Hearing/Vision screen Hearing Screening Comments: Pt reports no issues Vision Screening Comments: Goes to [facility]  Dietary issues and exercise activities discussed: Current Exercise Habits: Structured exercise class,  Type of exercise: Other - see comments(fhg classes), Time (Minutes): 30, Frequency (Times/Week): 3, Weekly Exercise (Minutes/Week): 90, Exercise limited by: None identified  Goals    . Maintain Lifestyle     Starting today pt will maintain lifestyle.       Depression Screen PHQ 2/9 Scores 01/10/2017  PHQ - 2 Score 0    Fall Risk Fall Risk  01/10/2017  Falls in the past year? No    Cognitive Function: MMSE - Mini Mental State Exam 01/10/2017  Orientation to time 5  Orientation to Place 5  Registration 3  Attention/ Calculation 5  Recall 2  Language- name 2 objects 2  Language- repeat 1  Language- follow 3 step command 3  Language- read & follow direction 1  Write a sentence 1  Copy design 1  Total score 29        Screening Tests Health Maintenance  Topic Date Due  . FOOT EXAM  07[birth year]09/1933  . OPHTHALMOLOGY EXAM  07/09/1935  . TETANUS/TDAP  07/08/1944  . DEXA SCAN  07/09/1990  . HEMOGLOBIN A1C  01/26/2016  . INFLUENZA VACCINE  09/12/2016  . PNA vac Low Risk Adult  Completed      Plan:    I have personally reviewed and addressed the Medicare Annual Wellness questionnaire and have noted the following in the patient's chart:  A. Medical and social history B. Use of alcohol, tobacco or illicit drugs  C. Current medications and supplements D. Functional ability and status E.  Nutritional status F.  Physical activity G. Advance directives H. List of other physicians I.  Hospitalizations, surgeries, and ER visits in previous 12 months J.  [place] to include hearing, vision, cognitive, depression L. Referrals and appointments - none  In addition, I have reviewed and discussed with patient certain preventive protocols, quality metrics, and best practice recommendations. A written personalized care plan for preventive services as well as general preventive health recommendations were provided to patient.  See attached scanned questionnaire for  additional information.   Signed,   Rich Reining, RN Nurse Health Advisor   Quick Notes   Health Maintenance: Shingrix prescription sent to pharamcy. Pt declined TDAP. Pt will make eye appointment. Foot exam and HgA1c due     Abnormal Screen: MMSE 29/30.      Patient Concerns: None     Nurse Concerns: None

## 2017-01-10 NOTE — Patient Instructions (Signed)
Jessica Walters , Thank you for taking time to come for your Medicare Wellness Visit. I appreciate your ongoing commitment to your health goals. Please review the following plan we discussed and let me know if I can assist you in the future.   Screening recommendations/referrals: Colonoscopy excluded, you are over age 81 Mammogram excluded, you are over age 37 Bone Density up to date Recommended yearly ophthalmology/optometry visit for glaucoma screening and checkup Recommended yearly dental visit for hygiene and checkup  Vaccinations: Influenza vaccine up to date. Due 2019 fall season Pneumococcal vaccine up to date Tdap vaccine due, declined Shingles vaccine due, prescription sent to pharmacy    Advanced directives: In Chart  Conditions/risks identified: None  Next appointment: Mast NP 02/28/2017 @ 1:30pm   Preventive Care 65 Years and Older, Female Preventive care refers to lifestyle choices and visits with your health care provider that can promote health and wellness. What does preventive care include?  A yearly physical exam. This is also called an annual well check.  Dental exams once or twice a year.  Routine eye exams. Ask your health care provider how often you should have your eyes checked.  Personal lifestyle choices, including:  Daily care of your teeth and gums.  Regular physical activity.  Eating a healthy diet.  Avoiding tobacco and drug use.  Limiting alcohol use.  Practicing safe sex.  Taking low-dose aspirin every day.  Taking vitamin and mineral supplements as recommended by your health care provider. What happens during an annual well check? The services and screenings done by your health care provider during your annual well check will depend on your age, overall health, lifestyle risk factors, and family history of disease. Counseling  Your health care provider may ask you questions about your:  Alcohol use.  Tobacco use.  Drug  use.  Emotional well-being.  Home and relationship well-being.  Sexual activity.  Eating habits.  History of falls.  Memory and ability to understand (cognition).  Work and work Statistician.  Reproductive health. Screening  You may have the following tests or measurements:  Height, weight, and BMI.  Blood pressure.  Lipid and cholesterol levels. These may be checked every 5 years, or more frequently if you are over 65 years old.  Skin check.  Lung cancer screening. You may have this screening every year starting at age 69 if you have a 30-pack-year history of smoking and currently smoke or have quit within the past 15 years.  Fecal occult blood test (FOBT) of the stool. You may have this test every year starting at age 29.  Flexible sigmoidoscopy or colonoscopy. You may have a sigmoidoscopy every 5 years or a colonoscopy every 10 years starting at age 62.  Hepatitis C blood test.  Hepatitis B blood test.  Sexually transmitted disease (STD) testing.  Diabetes screening. This is done by checking your blood sugar (glucose) after you have not eaten for a while (fasting). You may have this done every 1-3 years.  Bone density scan. This is done to screen for osteoporosis. You may have this done starting at age 19.  Mammogram. This may be done every 1-2 years. Talk to your health care provider about how often you should have regular mammograms. Talk with your health care provider about your test results, treatment options, and if necessary, the need for more tests. Vaccines  Your health care provider may recommend certain vaccines, such as:  Influenza vaccine. This is recommended every year.  Tetanus, diphtheria, and acellular  pertussis (Tdap, Td) vaccine. You may need a Td booster every 10 years.  Zoster vaccine. You may need this after age 90.  Pneumococcal 13-valent conjugate (PCV13) vaccine. One dose is recommended after age 84.  Pneumococcal polysaccharide  (PPSV23) vaccine. One dose is recommended after age 70. Talk to your health care provider about which screenings and vaccines you need and how often you need them. This information is not intended to replace advice given to you by your health care provider. Make sure you discuss any questions you have with your health care provider. Document Released: 02/25/2015 Document Revised: 10/19/2015 Document Reviewed: 11/30/2014 Elsevier Interactive Patient Education  2017 [place] Prevention in the Home Falls can cause injuries. They can happen to people of all ages. There are many things you can do to make your home safe and to help prevent falls. What can I do on the outside of my home?  Regularly fix the edges of walkways and driveways and fix any cracks.  Remove anything that might make you trip as you walk through a door, such as a raised step or threshold.  Trim any bushes or trees on the path to your home.  Use bright outdoor lighting.  Clear any walking paths of anything that might make someone trip, such as rocks or tools.  Regularly check to see if handrails are loose or broken. Make sure that both sides of any steps have handrails.  Any raised decks and porches should have guardrails on the edges.  Have any leaves, snow, or ice cleared regularly.  Use sand or salt on walking paths during winter.  Clean up any spills in your garage right away. This includes oil or grease spills. What can I do in the bathroom?  Use night lights.  Install grab bars by the toilet and in the tub and shower. Do not use towel bars as grab bars.  Use non-skid mats or decals in the tub or shower.  If you need to sit down in the shower, use a plastic, non-slip stool.  Keep the floor dry. Clean up any water that spills on the floor as soon as it happens.  Remove soap buildup in the tub or shower regularly.  Attach bath mats securely with double-sided non-slip rug tape.  Do not have  throw rugs and other things on the floor that can make you trip. What can I do in the bedroom?  Use night lights.  Make sure that you have a light by your bed that is easy to reach.  Do not use any sheets or blankets that are too big for your bed. They should not hang down onto the floor.  Have a firm chair that has side arms. You can use this for support while you get dressed.  Do not have throw rugs and other things on the floor that can make you trip. What can I do in the kitchen?  Clean up any spills right away.  Avoid walking on wet floors.  Keep items that you use a lot in easy-to-reach places.  If you need to reach something above you, use a strong step stool that has a grab bar.  Keep electrical cords out of the way.  Do not use floor polish or wax that makes floors slippery. If you must use wax, use non-skid floor wax.  Do not have throw rugs and other things on the floor that can make you trip. What can I do with my stairs?  Do not leave any items on the stairs.  Make sure that there are handrails on both sides of the stairs and use them. Fix handrails that are broken or loose. Make sure that handrails are as long as the stairways.  Check any carpeting to make sure that it is firmly attached to the stairs. Fix any carpet that is loose or worn.  Avoid having throw rugs at the top or bottom of the stairs. If you do have throw rugs, attach them to the floor with carpet tape.  Make sure that you have a light switch at the top of the stairs and the bottom of the stairs. If you do not have them, ask someone to add them for you. What else can I do to help prevent falls?  Wear shoes that:  Do not have high heels.  Have rubber bottoms.  Are comfortable and fit you well.  Are closed at the toe. Do not wear sandals.  If you use a stepladder:  Make sure that it is fully opened. Do not climb a closed stepladder.  Make sure that both sides of the stepladder are  locked into place.  Ask someone to hold it for you, if possible.  Clearly mark and make sure that you can see:  Any grab bars or handrails.  First and last steps.  Where the edge of each step is.  Use tools that help you move around (mobility aids) if they are needed. These include:  Canes.  Walkers.  Scooters.  Crutches.  Turn on the lights when you go into a dark area. Replace any light bulbs as soon as they burn out.  Set up your furniture so you have a clear path. Avoid moving your furniture around.  If any of your floors are uneven, fix them.  If there are any pets around you, be aware of where they are.  Review your medicines with your doctor. Some medicines can make you feel dizzy. This can increase your chance of falling. Ask your doctor what other things that you can do to help prevent falls. This information is not intended to replace advice given to you by your health care provider. Make sure you discuss any questions you have with your health care provider. Document Released: 11/25/2008 Document Revised: 07/07/2015 Document Reviewed: 03/05/2014 Elsevier Interactive Patient Education  2017 [employer].

## 2017-02-25 NOTE — Addendum Note (Signed)
Addended by: Eilene Ghazi on: 02/25/2017 09:33 AM   Modules accepted: Orders

## 2017-02-26 ENCOUNTER — Other Ambulatory Visit: Payer: PPO

## 2017-02-26 DIAGNOSIS — E1100 Type 2 diabetes mellitus with hyperosmolarity without nonketotic hyperglycemic-hyperosmolar coma (NKHHC): Secondary | ICD-10-CM | POA: Diagnosis not present

## 2017-02-26 DIAGNOSIS — I10 Essential (primary) hypertension: Secondary | ICD-10-CM | POA: Diagnosis not present

## 2017-02-27 LAB — COMPLETE METABOLIC PANEL WITH GFR
AG RATIO: 2.1 (calc) (ref 1.0–2.5)
ALBUMIN MSPROF: 4.1 g/dL (ref 3.6–5.1)
ALKALINE PHOSPHATASE (APISO): 60 U/L (ref 33–130)
ALT: 11 U/L (ref 6–29)
AST: 12 U/L (ref 10–35)
BUN / CREAT RATIO: 28 (calc) — AB (ref 6–22)
BUN: 26 mg/dL — ABNORMAL HIGH (ref 7–25)
CO2: 32 mmol/L (ref 20–32)
CREATININE: 0.92 mg/dL — AB (ref 0.60–0.88)
Calcium: 9.7 mg/dL (ref 8.6–10.4)
Chloride: 103 mmol/L (ref 98–110)
GFR, EST NON AFRICAN AMERICAN: 54 mL/min/{1.73_m2} — AB (ref >=60)
GFR, Est African American: 63 mL/min/{1.73_m2} (ref >=60)
GLOBULIN: 2 g/dL (ref 1.9–3.7)
Glucose, Bld: 137 mg/dL — ABNORMAL HIGH (ref 65–99)
POTASSIUM: 3.9 mmol/L (ref 3.5–5.3)
SODIUM: 136 mmol/L (ref 135–146)
Total Bilirubin: 0.7 mg/dL (ref 0.2–1.2)
Total Protein: 6.1 g/dL (ref 6.1–8.1)

## 2017-02-27 LAB — CBC
HEMATOCRIT: 42.8 % (ref 35.0–45.0)
HEMOGLOBIN: 14.7 g/dL (ref 11.7–15.5)
MCH: 31.5 pg (ref 27.0–33.0)
MCHC: 34.3 g/dL (ref 32.0–36.0)
MCV: 91.6 fL (ref 80.0–100.0)
MPV: 10.1 fL (ref 7.5–12.5)
Platelets: 199 10*3/uL (ref 140–400)
RBC: 4.67 10*6/uL (ref 3.80–5.10)
RDW: 13.5 % (ref 11.0–15.0)
WBC: 7.2 10*3/uL (ref 3.8–10.8)

## 2017-02-27 LAB — HEPATIC FUNCTION PANEL
AG RATIO: 2.1 (calc) (ref 1.0–2.5)
ALKALINE PHOSPHATASE (APISO): 60 U/L (ref 33–130)
ALT: 11 U/L (ref 6–29)
AST: 12 U/L (ref 10–35)
Albumin: 4.1 g/dL (ref 3.6–5.1)
BILIRUBIN INDIRECT: 0.5 mg/dL (ref 0.2–1.2)
BILIRUBIN TOTAL: 0.7 mg/dL (ref 0.2–1.2)
Bilirubin, Direct: 0.2 mg/dL (ref 0.0–0.2)
Globulin: 2 g/dL (calc) (ref 1.9–3.7)
TOTAL PROTEIN: 6.1 g/dL (ref 6.1–8.1)

## 2017-02-27 LAB — HEMOGLOBIN A1C
Hgb A1c MFr Bld: 7 % of total Hgb — ABNORMAL HIGH (ref <5.7)
MEAN PLASMA GLUCOSE: 154 (calc)
eAG (mmol/L): 8.5 (calc)

## 2017-02-28 ENCOUNTER — Non-Acute Institutional Stay: Payer: PPO | Admitting: Nurse Practitioner

## 2017-02-28 ENCOUNTER — Encounter: Payer: Self-pay | Admitting: Nurse Practitioner

## 2017-02-28 DIAGNOSIS — I10 Essential (primary) hypertension: Secondary | ICD-10-CM

## 2017-02-28 DIAGNOSIS — N8110 Cystocele, unspecified: Secondary | ICD-10-CM

## 2017-02-28 DIAGNOSIS — E1100 Type 2 diabetes mellitus with hyperosmolarity without nonketotic hyperglycemic-hyperosmolar coma (NKHHC): Secondary | ICD-10-CM

## 2017-02-28 DIAGNOSIS — E782 Mixed hyperlipidemia: Secondary | ICD-10-CM | POA: Diagnosis not present

## 2017-02-28 NOTE — Assessment & Plan Note (Addendum)
Hgb a1c 7.0 02/26/17, last Hgb a1c 6.8 08/17/16, continue Metformin 500mg  bid, will continue better diet, update Hgb a1c, CBC,  prior to the next appointment

## 2017-02-28 NOTE — Patient Instructions (Signed)
lipids, Hgb a1c, CBC, CMP, Next appt:  6 months.

## 2017-02-28 NOTE — Assessment & Plan Note (Addendum)
Pessary, f/u Urology, urinary leakage. May GYN per patient's preference in the future.

## 2017-02-28 NOTE — Assessment & Plan Note (Addendum)
Blood pressure is controlled, continue Atenolol 50mg  qd, Enalapril 20mg  bid, HCT 25mg , update CMP prior to the next appointment.

## 2017-02-28 NOTE — Assessment & Plan Note (Signed)
Continue to statin, update lipid panel.

## 2017-02-28 NOTE — Progress Notes (Signed)
Location:   [facility]inic [facility]   Place of Service:  Clinic (12) Provider: Marlana Latus NP  Code Status: DNR Goals of Care: [place] 01/10/2017  Does Patient Have a Medical Advance Directive? Yes  Type of Advance Directive [place]  Does patient want to make changes to medical advance directive? No - Patient declined  Copy of [place] in Chart? Yes     Chief Complaint  Patient presents with  . Medical Management of Chronic Issues    6 mo f/u w/labs    HPI: Patient is a 84 y.o. female seen today for medical management of chronic diseases.    The patient has history of  T2DM, taking Metformin 500mg  bid po, last Hgb a1c 7.0 02/26/17, goal is to be <7. Her blood pressures Atenolol 50mg  qd, Enalapril 20mg  bid, HCT 25mg  po daily. LDL 48 08/2016, on statin for 30 years.   Past Medical History:  Diagnosis Date  . Coronary arteriosclerosis   . Diabetes mellitus without complication (HCC)    Type 2   . Hyperlipidemia   . Hypertension   . Myocardial infarction [facility])     Past Surgical History:  Procedure Laterality Date  . APPENDECTOMY  1988   Dr. Collier Salina  . CORONARY ARTERY BYPASS GRAFT  1990  . EYE SURGERY  2012   Dr, Vickki Muff  . KYPHOPLASTY  2015   Dr. Donivan Scull    No Known Allergies  Allergies as of 02/28/2017   No Known Allergies     Medication List        Accurate as of 02/28/17 11:59 PM. Always use your most recent med list.          aspirin EC 81 MG tablet Take 81 mg by mouth daily.   atenolol 50 MG tablet Commonly known as:  TENORMIN Take 1 tablet (50 mg total) by mouth daily.   atorvastatin 20 MG tablet Commonly known as:  LIPITOR Take 1 tablet (20 mg total) by mouth daily.   Calcium Carbonate-Vitamin D3 600-400 MG-UNIT Tabs Take 600 mg by mouth 2 (two) times daily with a meal.   enalapril 20 MG tablet Commonly known as:  VASOTEC Take 1 tablet (20 mg total) by mouth 2 (two) times daily.     hydrochlorothiazide 25 MG tablet Commonly known as:  HYDRODIURIL Take 1 tablet (25 mg total) by mouth daily.   metFORMIN 500 MG tablet Commonly known as:  GLUCOPHAGE Take 1 tablet (500 mg total) by mouth 2 (two) times daily with a meal.       Review of Systems:  Review of Systems  Constitutional: Negative for activity change, appetite change, chills, diaphoresis, fatigue and fever.  HENT: Negative for congestion, hearing loss, trouble swallowing and voice change.   Eyes: Negative for visual disturbance.  Respiratory: Positive for cough. Negative for shortness of breath and wheezing.        Enalapril causes hacking cough.   Cardiovascular: Negative for palpitations and leg swelling.  Gastrointestinal: Negative for abdominal distention, abdominal pain, constipation, diarrhea, nausea and vomiting.  Endocrine: Negative for cold intolerance.  Genitourinary: Negative for difficulty urinating, dysuria and urgency.       Pessary, incontinent of urine.   Musculoskeletal: Negative for back pain and gait problem.  Skin: Negative for color change and pallor.  Neurological: Negative for tremors, weakness, numbness and headaches.  Psychiatric/Behavioral: Negative for agitation, behavioral problems, confusion, hallucinations and sleep disturbance. The patient is not nervous/anxious.  Health Maintenance  Topic Date Due  . FOOT EXAM  07[birth year]09/1935  . OPHTHALMOLOGY EXAM  07/09/1935  . TETANUS/TDAP  07/08/1944  . INFLUENZA VACCINE  09/12/2016  . HEMOGLOBIN A1C  08/26/2017  . DEXA SCAN  Completed  . PNA vac Low Risk Adult  Completed    Physical Exam: Vitals:   02/28/17 1320  BP: (!) 147/80  Pulse: 74  Resp: 20  Temp: (!) 97.4 F (36.3 C)  SpO2: 96%  Weight: 159 lb 6.4 oz (72.3 kg)  Height: 5\' 5"  (1.651 m)   Body mass index is 26.53 kg/m. Physical Exam  Constitutional: She is oriented to person, place, and time. She appears well-developed and well-nourished. No distress.  HENT:   Head: Normocephalic and atraumatic.  Eyes: Conjunctivae and EOM are normal. Pupils are equal, round, and reactive to light.  Neck: Normal range of motion. Neck supple. No JVD present. No thyromegaly present.  Cardiovascular: Normal rate, regular rhythm and normal heart sounds.  No murmur heard. Pulmonary/Chest: Effort normal and breath sounds normal. She has no wheezes. She has no rales.  Abdominal: Soft. Bowel sounds are normal. She exhibits no distension. There is no tenderness.  Musculoskeletal: Normal range of motion. She exhibits no edema or tenderness.  Neurological: She is alert and oriented to person, place, and time. She exhibits normal muscle tone. Coordination normal.  Skin: Skin is warm and dry. No rash noted. She is not diaphoretic. No erythema.  Psychiatric: She has a normal mood and affect. Her behavior is normal. Judgment and thought content normal.    Labs reviewed: Basic Metabolic Panel: Recent Labs    02/26/17 0705  NA 136  K 3.9  CL 103  CO2 32  GLUCOSE 137*  BUN 26*  CREATININE 0.92*  CALCIUM 9.7   Liver Function Tests: Recent Labs    02/26/17 0705  AST 12  12  ALT 11  11  BILITOT 0.7  0.7  PROT 6.1  6.1   No results for input(s): LIPASE, AMYLASE in the last 8760 hours. No results for input(s): AMMONIA in the last 8760 hours. CBC: Recent Labs    02/26/17 0705  WBC 7.2  HGB 14.7  HCT 42.8  MCV 91.6  PLT 199   Lipid Panel: No results for input(s): CHOL, HDL, LDLCALC, TRIG, CHOLHDL, LDLDIRECT in the last 8760 hours. Lab Results  Component Value Date   HGBA1C 7.0 (H) 02/26/2017    Procedures since last visit: No results found.  Assessment/Plan  Type 2 diabetes mellitus (HCC) Hgb a1c 7.0 02/26/17, last Hgb a1c 6.8 08/17/16, continue Metformin 500mg  bid, will continue better diet, update Hgb a1c, CBC,  prior to the next appointment  HTN (hypertension) Blood pressure is controlled, continue Atenolol 50mg  qd, Enalapril 20mg  bid, HCT  25mg , update CMP prior to the next appointment.   Hyperlipidemia Continue to statin, update lipid panel.   Cystocele, unspecified (CODE) Pessary, f/u Urology, urinary leakage. [facility] per patient's preference in the future.   Labs/tests ordered: lipids, Hgb a1c, CBC, CMP  Next appt:  6 months.   Time spend 25 minutes.

## 2017-03-12 ENCOUNTER — Encounter: Payer: Self-pay | Admitting: Internal Medicine

## 2017-03-12 ENCOUNTER — Ambulatory Visit: Payer: PPO | Admitting: Internal Medicine

## 2017-03-12 VITALS — BP 142/62 | HR 75 | Temp 98.2°F | Resp 16 | Ht 65.0 in | Wt 153.2 lb

## 2017-03-12 DIAGNOSIS — R21 Rash and other nonspecific skin eruption: Secondary | ICD-10-CM | POA: Diagnosis not present

## 2017-03-12 MED ORDER — HYDROCORTISONE 1 % EX OINT: 1.0000 "application " | TOPICAL_OINTMENT | Freq: Two times a day (BID) | CUTANEOUS | 0 refills | Status: DC

## 2017-03-12 MED ORDER — DIPHENHYDRAMINE HCL 25 MG PO CAPS: 25.0000 mg | ORAL_CAPSULE | Freq: Three times a day (TID) | ORAL | 0 refills | Status: DC | PRN

## 2017-03-12 NOTE — Patient Instructions (Signed)
  Take benadryl 25 mg 2 tablet every 8 hours x 2 days, then 1 tablet every 8 hours x 2 days, then 1 tablet every 8 hours as needed only for itching. While taking benadryl round the clock, I would not advise you to drive.  If your itching or rash does not improve or it worsens, let us know right away.

## 2017-03-12 NOTE — Progress Notes (Signed)
[facility]  Provider: Blanchie Serve MD   Location:      Place of Service:     PCP: Blanchie Serve, MD Patient Care Team: Blanchie Serve, MD as PCP - General (Internal Medicine) Mast, Man X, NP as Nurse Practitioner (Internal Medicine)  Extended Emergency Contact Information Primary Emergency Contact: Phillips,Hal Address: [address]          [place], [place] [address] [place] Phone: [phone] Relation: Son   Goals of Care: Advanced Directive information Advanced Directives 01/10/2017  Does Patient Have a Medical Advance Directive? Yes  Type of Advance Directive [place]  Does patient want to make changes to medical advance directive? No - Patient declined  Copy of [place] in Chart? Yes     Chief Complaint  Patient presents with  . Acute Visit    Patient has some concerns about a rash that has appeared on her neck and right side of her chest. Patient stated that she has been using Benadryl cream to put on the rash. Some itching and burning present.     HPI: Patient is a 84 y.o. female seen today for acute visit for rash. She felt a bump on left side of her neck last evening and then it started itching. It spread to other side and chest. She got up at night and took benadryl and this helped take some itch away. She  Wore a turtle neck shirt yesterday that she had worn 2 weeks back and had kept it in her closet without washing. She is concerned about bed bug. No new chemical product used, no new linens used, no new medication used. Pt denies being outdoor. No rash elsewhere.   Past Medical History:  Diagnosis Date  . Coronary arteriosclerosis   . Diabetes mellitus without complication (HCC)    Type 2   . Hyperlipidemia   . Hypertension   . Myocardial infarction [facility])    Past Surgical History:  Procedure Laterality Date  . APPENDECTOMY  1988   Dr. Collier Salina  . CORONARY ARTERY BYPASS  GRAFT  1990  . EYE SURGERY  2012   Dr, Vickki Muff  . KYPHOPLASTY  2015   Dr. Donivan Scull    reports that  has never smoked. she has never used smokeless tobacco. She reports that she drinks alcohol. Her drug history is not on file. Social History   Socioeconomic History  . Marital status: Widowed    Spouse name: Not on file  . Number of children: Not on file  . Years of education: Not on file  . Highest education level: Not on file  Social Needs  . Financial resource strain: Not hard at all  . Food insecurity - worry: Never true  . Food insecurity - inability: Never true  . Transportation needs - medical: No  . Transportation needs - non-medical: No  Occupational History  . Not on file  Tobacco Use  . Smoking status: Never Smoker  . Smokeless tobacco: Never Used  Substance and Sexual Activity  . Alcohol use: Yes    Comment: 2-3 weekly  . Drug use: Not on file  . Sexual activity: Not on file  Other Topics Concern  . Not on file  Social History Narrative  . Not on file     Family History  Problem Relation Age of Onset  . Hypertension Mother   . Lung cancer Father     Health Maintenance  Topic Date Due  . FOOT EXAM  07/09/1935  . OPHTHALMOLOGY EXAM  07/09/1935  . TETANUS/TDAP  07/08/1944  . INFLUENZA VACCINE  09/12/2016  . HEMOGLOBIN A1C  08/26/2017  . DEXA SCAN  Completed  . PNA vac Low Risk Adult  Completed    Allergies  Allergen Reactions  . Poison Ivy Extract Rash    Outpatient Encounter Medications as of 03/12/2017  Medication Sig  . aspirin EC 81 MG tablet Take 81 mg by mouth daily.  Marland Kitchen atenolol (TENORMIN) 50 MG tablet Take 1 tablet (50 mg total) by mouth daily.  Marland Kitchen atorvastatin (LIPITOR) 20 MG tablet Take 1 tablet (20 mg total) by mouth daily.  . Calcium Carbonate-Vitamin D3 600-400 MG-UNIT TABS Take 600 mg by mouth 2 (two) times daily with a meal.  . enalapril (VASOTEC) 20 MG tablet Take 1 tablet (20 mg total) by mouth 2 (two) times daily.  .  hydrochlorothiazide (HYDRODIURIL) 25 MG tablet Take 1 tablet (25 mg total) by mouth daily.  . metFORMIN (GLUCOPHAGE) 500 MG tablet Take 1 tablet (500 mg total) by mouth 2 (two) times daily with a meal.   No facility-administered encounter medications on file as of 03/12/2017.     Review of Systems  Constitutional: Negative for chills and fever.  HENT: Negative for trouble swallowing and voice change.   Respiratory: Negative for choking, shortness of breath, wheezing and stridor.   Skin: Positive for rash.  Psychiatric/Behavioral: Negative for confusion.    Vitals:   03/12/17 0935  BP: (!) 142/62  Pulse: 75  Resp: 16  Temp: 98.2 F (36.8 C)  TempSrc: Oral  SpO2: 95%  Weight: 153 lb 3.2 oz (69.5 kg)  Height: 5\' 5"  (1.651 m)   Body mass index is 25.49 kg/m. Physical Exam  Constitutional: She is oriented to person, place, and time. She appears well-developed and well-nourished. No distress.  HENT:  Head: Normocephalic and atraumatic.  Mouth/Throat: Oropharynx is clear and moist. No oropharyngeal exudate.  Eyes: Conjunctivae and EOM are normal. Pupils are equal, round, and reactive to light. Right eye exhibits no discharge. Left eye exhibits no discharge.  Neck: Neck supple.  Cardiovascular: Normal rate and regular rhythm.  Pulmonary/Chest: Effort normal and breath sounds normal.  Abdominal: Soft.  Lymphadenopathy:    She has no cervical adenopathy.  Neurological: She is alert and oriented to person, place, and time.  Skin: Skin is warm and dry. She is not diaphoretic. There is erythema.  Erythematous papules with induration, non tender, blanchable, no drainage, normal temeprature to right and left neck area and anterior neck (submental area), few papules on right anterior chest wall. No clear bite mark noted  Psychiatric: She has a normal mood and affect.    Labs reviewed: Basic Metabolic Panel: Recent Labs    02/26/17 0705  NA 136  K 3.9  CL 103  CO2 32  GLUCOSE  137*  BUN 26*  CREATININE 0.92*  CALCIUM 9.7   Liver Function Tests: Recent Labs    02/26/17 0705  AST 12  12  ALT 11  11  BILITOT 0.7  0.7  PROT 6.1  6.1   No results for input(s): LIPASE, AMYLASE in the last 8760 hours. No results for input(s): AMMONIA in the last 8760 hours. CBC: Recent Labs    02/26/17 0705  WBC 7.2  HGB 14.7  HCT 42.8  MCV 91.6  PLT 199   Cardiac Enzymes: No results for input(s): CKTOTAL, CKMB, CKMBINDEX, TROPONINI in the last  8760 hours. BNP: Invalid input(s): POCBNP Lab Results  Component Value Date   HGBA1C 7.0 (H) 02/26/2017   No results found for: TSH No results found for: VITAMINB12 No results found for: FOLATE No results found for: IRON, TIBC, FERRITIN  Lipid Panel: No results for input(s): CHOL, HDL, LDLCALC, TRIG, CHOLHDL, LDLDIRECT in the last 8760 hours. Lab Results  Component Value Date   HGBA1C 7.0 (H) 02/26/2017    Procedures since last visit: No results found.  Assessment/Plan  1. Erythematous rash Scattered papule like lesion with erythema to neck and chest area as described above. No clinical signs of  infection. Possible reaction to an insect bite or allergic reaction to something that came in contact with her skin - ? May from turtle neck top she was wearing 2 weeks back that she wore again?Marland Kitchen No clear bite mark noted. She mentions that the itching is tolerable and benadryl has been helpful. Conservative management for now with benadryl 50 mg tid x 2 days, then 25 mg tid x 2 days and then every 8 hour as needed. Apply hydrocortisone cream 1% to affected areas bid. Reassess if no improvement or rash worsens.     Labs/tests ordered:  none  Next appointment: as needed  Communication: reviewed care plan with patient     Blanchie Serve, MD Internal Medicine [place], [place] [address] Cell Phone (Monday-Friday 8 am - 5 pm): [phone] On Call:  [phone] and follow prompts after 5 pm and on weekends Office Phone: [phone] Office Fax: [phone]

## 2017-04-08 ENCOUNTER — Encounter: Payer: Self-pay | Admitting: Nurse Practitioner

## 2017-04-23 DIAGNOSIS — E119 Type 2 diabetes mellitus without complications: Secondary | ICD-10-CM | POA: Diagnosis not present

## 2017-04-23 DIAGNOSIS — H5203 Hypermetropia, bilateral: Secondary | ICD-10-CM | POA: Diagnosis not present

## 2017-05-22 DIAGNOSIS — N952 Postmenopausal atrophic vaginitis: Secondary | ICD-10-CM | POA: Diagnosis not present

## 2017-05-22 DIAGNOSIS — N8111 Cystocele, midline: Secondary | ICD-10-CM | POA: Diagnosis not present

## 2017-06-11 ENCOUNTER — Other Ambulatory Visit: Payer: Self-pay | Admitting: Nurse Practitioner

## 2017-06-11 ENCOUNTER — Other Ambulatory Visit: Payer: Self-pay | Admitting: *Deleted

## 2017-06-11 DIAGNOSIS — E785 Hyperlipidemia, unspecified: Secondary | ICD-10-CM

## 2017-06-11 DIAGNOSIS — I10 Essential (primary) hypertension: Secondary | ICD-10-CM

## 2017-06-11 DIAGNOSIS — E1100 Type 2 diabetes mellitus with hyperosmolarity without nonketotic hyperglycemic-hyperosmolar coma (NKHHC): Secondary | ICD-10-CM

## 2017-06-11 DIAGNOSIS — I252 Old myocardial infarction: Secondary | ICD-10-CM

## 2017-06-11 MED ORDER — HYDROCHLOROTHIAZIDE 25 MG PO TABS: 25.0000 mg | ORAL_TABLET | Freq: Every day | ORAL | 2 refills | Status: DC

## 2017-06-11 MED ORDER — ATENOLOL 50 MG PO TABS: 50.0000 mg | ORAL_TABLET | Freq: Every day | ORAL | 2 refills | Status: DC

## 2017-06-11 MED ORDER — ATORVASTATIN CALCIUM 20 MG PO TABS: 20.0000 mg | ORAL_TABLET | Freq: Every day | ORAL | 2 refills | Status: DC

## 2017-06-11 MED ORDER — METFORMIN HCL 500 MG PO TABS: 500.0000 mg | ORAL_TABLET | Freq: Two times a day (BID) | ORAL | 2 refills | Status: DC

## 2017-06-11 MED ORDER — ENALAPRIL MALEATE 20 MG PO TABS: 20.0000 mg | ORAL_TABLET | Freq: Two times a day (BID) | ORAL | 2 refills | Status: DC

## 2017-08-28 ENCOUNTER — Encounter: Payer: Self-pay | Admitting: Nurse Practitioner

## 2017-08-29 ENCOUNTER — Encounter: Payer: Self-pay | Admitting: Nurse Practitioner

## 2017-08-29 ENCOUNTER — Ambulatory Visit: Payer: Self-pay | Admitting: Nurse Practitioner

## 2017-09-11 ENCOUNTER — Other Ambulatory Visit: Payer: Self-pay | Admitting: *Deleted

## 2017-09-11 DIAGNOSIS — I10 Essential (primary) hypertension: Secondary | ICD-10-CM

## 2017-09-12 DIAGNOSIS — E1100 Type 2 diabetes mellitus with hyperosmolarity without nonketotic hyperglycemic-hyperosmolar coma (NKHHC): Secondary | ICD-10-CM

## 2017-09-12 DIAGNOSIS — I10 Essential (primary) hypertension: Secondary | ICD-10-CM

## 2017-09-12 DIAGNOSIS — E782 Mixed hyperlipidemia: Secondary | ICD-10-CM

## 2017-09-13 LAB — CBC
HEMATOCRIT: 42.1 % (ref 35.0–45.0)
HEMOGLOBIN: 13.9 g/dL (ref 11.7–15.5)
MCH: 30.6 pg (ref 27.0–33.0)
MCHC: 33 g/dL (ref 32.0–36.0)
MCV: 92.7 fL (ref 80.0–100.0)
MPV: 10.3 fL (ref 7.5–12.5)
Platelets: 167 10*3/uL (ref 140–400)
RBC: 4.54 10*6/uL (ref 3.80–5.10)
RDW: 13.2 % (ref 11.0–15.0)
WBC: 6.8 10*3/uL (ref 3.8–10.8)

## 2017-09-13 LAB — COMPREHENSIVE METABOLIC PANEL
AG RATIO: 1.9 (calc) (ref 1.0–2.5)
ALT: 12 U/L (ref 6–29)
AST: 14 U/L (ref 10–35)
Albumin: 3.9 g/dL (ref 3.6–5.1)
Alkaline phosphatase (APISO): 58 U/L (ref 33–130)
BILIRUBIN TOTAL: 0.7 mg/dL (ref 0.2–1.2)
BUN/Creatinine Ratio: 22 (calc) (ref 6–22)
BUN: 20 mg/dL (ref 7–25)
CALCIUM: 9.4 mg/dL (ref 8.6–10.4)
CO2: 24 mmol/L (ref 20–32)
Chloride: 103 mmol/L (ref 98–110)
Creat: 0.91 mg/dL — ABNORMAL HIGH (ref 0.60–0.88)
GLUCOSE: 133 mg/dL — AB (ref 65–99)
Globulin: 2.1 g/dL (calc) (ref 1.9–3.7)
Potassium: 4.1 mmol/L (ref 3.5–5.3)
SODIUM: 139 mmol/L (ref 135–146)
TOTAL PROTEIN: 6 g/dL — AB (ref 6.1–8.1)

## 2017-09-13 LAB — HEPATIC FUNCTION PANEL
AG Ratio: 1.9 (calc) (ref 1.0–2.5)
ALBUMIN MSPROF: 3.9 g/dL (ref 3.6–5.1)
ALT: 12 U/L (ref 6–29)
AST: 14 U/L (ref 10–35)
Alkaline phosphatase (APISO): 58 U/L (ref 33–130)
Bilirubin, Direct: 0.2 mg/dL (ref 0.0–0.2)
GLOBULIN: 2.1 g/dL (ref 1.9–3.7)
Indirect Bilirubin: 0.5 mg/dL (calc) (ref 0.2–1.2)
TOTAL PROTEIN: 6 g/dL — AB (ref 6.1–8.1)
Total Bilirubin: 0.7 mg/dL (ref 0.2–1.2)

## 2017-09-13 LAB — HEMOGLOBIN A1C
EAG (MMOL/L): 9.2 (calc)
Hgb A1c MFr Bld: 7.4 % of total Hgb — ABNORMAL HIGH (ref <5.7)
Mean Plasma Glucose: 166 (calc)

## 2017-09-19 ENCOUNTER — Encounter: Payer: Self-pay | Admitting: Nurse Practitioner

## 2017-09-19 ENCOUNTER — Non-Acute Institutional Stay: Payer: PPO | Admitting: Nurse Practitioner

## 2017-09-19 DIAGNOSIS — I252 Old myocardial infarction: Secondary | ICD-10-CM | POA: Diagnosis not present

## 2017-09-19 DIAGNOSIS — E1100 Type 2 diabetes mellitus with hyperosmolarity without nonketotic hyperglycemic-hyperosmolar coma (NKHHC): Secondary | ICD-10-CM

## 2017-09-19 DIAGNOSIS — I10 Essential (primary) hypertension: Secondary | ICD-10-CM

## 2017-09-19 DIAGNOSIS — N8110 Cystocele, unspecified: Secondary | ICD-10-CM

## 2017-09-19 NOTE — Assessment & Plan Note (Signed)
Continue pessar

## 2017-09-19 NOTE — Assessment & Plan Note (Signed)
Hx of MI/CAD about 30 years ago, continue  ASA, Atorvastatin, Atenolol, Enalapril, no angina, occasionally DOE which is chronic since MI, declined echocardiogram or EKG

## 2017-09-19 NOTE — Assessment & Plan Note (Addendum)
HTN, blood pressure is controlled, but not today 160/100mg Hg today, she denied HA, vision change, dizziness, chest pain, palpitation, or SOB. No O2 desaturation. The patient doesn't want more medications, continue HCT 25mg  qd, Enalapril 20mg  bid, Atenolol 50mg  qd. Monitor Bp at home. Bp 160/82 mmHg upon leaving clinic.

## 2017-09-19 NOTE — Assessment & Plan Note (Signed)
T2DM, Hgb a1c 7.4 09/13/17, continue Metformin 500mg  bid po, her goal of a1c is 8. She exercise and diet as usual.

## 2017-09-19 NOTE — Patient Instructions (Signed)
F/u in clinic 6 months, exercise and diet, goal of Hgb a1c <8 per patient's preference. Monitor BP at home, bring Bp log in for review in one week.

## 2017-09-19 NOTE — Progress Notes (Signed)
Location:   clinic [facility]   Place of Service:  Clinic (12) Provider: Marlana Latus NP  Code Status: DNR Goals of Care: [place] Advanced [place]ives 01/10/2017  Does Patient Have a Medical Advance Directive? Yes  Type of Advance Directive [place]  Does patient want to make changes to medical advance directive? No - Patient declined  Copy of [place] in Chart? Yes     Chief Complaint  Patient presents with  . Medical Management of Chronic Issues    6 mo f/u with labs    HPI: Patient is a 84 y.o. female seen today managing chronic medical conditions   Hx of HTN, blood pressure is controlled, but not today 160/100mg Hg today, she denied HA, vision change, dizziness, chest pain, palpitation, or SOB. No O2 desaturation. The patient doesn't want more medications, on HCT 25mg  qd, Enalapril 20mg  bid, Atenolol 50mg  qd. She is going to monitor Bp at home. T2DM, Hgb a1c 7.4 09/13/17, Metformin 500mg  bid po, her goal of a1c is 8. She exercise and diet as usual. Hx of MI/CAD about 30 years ago, on ASA, Atorvastatin, Atenolol, Enalapril, no angina, occasionally DOE which is chronic since MI, declined echocardiogram or EKG  Past Medical History:  Diagnosis Date  . Coronary arteriosclerosis   . Diabetes mellitus without complication (HCC)    Type 2   . Hyperlipidemia   . Hypertension   . Myocardial infarction [facility])     Past Surgical History:  Procedure Laterality Date  . APPENDECTOMY  1988   Dr. Collier Salina  . CORONARY ARTERY BYPASS GRAFT  1990  . EYE SURGERY  2012   Dr, Vickki Muff  . KYPHOPLASTY  2015   Dr. Donivan Scull    Allergies  Allergen Reactions  . Poison Ivy Extract Rash    Allergies as of 09/19/2017      Reactions   Poison Ivy Extract Rash      Medication List        Accurate as of 09/19/17 11:59 PM. Always use your most recent med list.          aspirin EC 81 MG tablet Take 81 mg by mouth daily.   atenolol 50 MG tablet Commonly known as:   TENORMIN Take 1 tablet (50 mg total) by mouth daily.   atorvastatin 20 MG tablet Commonly known as:  LIPITOR Take 1 tablet (20 mg total) by mouth daily.   Calcium Carbonate-Vitamin D3 600-400 MG-UNIT Tabs Take 600 mg by mouth 2 (two) times daily with a meal.   enalapril 20 MG tablet Commonly known as:  VASOTEC Take 1 tablet (20 mg total) by mouth 2 (two) times daily.   hydrochlorothiazide 25 MG tablet Commonly known as:  HYDRODIURIL Take 1 tablet (25 mg total) by mouth daily.   metFORMIN 500 MG tablet Commonly known as:  GLUCOPHAGE Take 1 tablet (500 mg total) by mouth 2 (two) times daily with a meal.       Review of Systems:  Review of Systems  Constitutional: Negative for activity change, appetite change, chills, diaphoresis and fatigue.  HENT: Positive for hearing loss. Negative for congestion, trouble swallowing and voice change.   Respiratory: Negative for cough, shortness of breath and wheezing.   Cardiovascular: Positive for leg swelling. Negative for chest pain.  Gastrointestinal: Negative for abdominal distention, abdominal pain, constipation, diarrhea, nausea and vomiting.  Genitourinary: Positive for frequency. Negative for difficulty urinating, dysuria and urgency.       Pessary  Musculoskeletal:  Positive for gait problem. Negative for arthralgias and back pain.  Skin: Negative for color change and pallor.  Neurological: Negative for dizziness, speech difficulty and headaches.  Psychiatric/Behavioral: Positive for sleep disturbance. Negative for agitation, behavioral problems, confusion and hallucinations.       Sleeps 5-6 hours per night for 30 years, naps after lunch daily.     Health Maintenance  Topic Date Due  . FOOT EXAM  07/09/1933  . OPHTHALMOLOGY EXAM  07[birth year]09/1935  . TETANUS/TDAP  07/08/1944  . INFLUENZA VACCINE  09/12/2017  . HEMOGLOBIN A1C  03/15/2018  . DEXA SCAN  Completed  . PNA vac Low Risk Adult  Completed    Physical Exam: Vitals:    09/19/17 1329 09/19/17 1428  BP: (!) 160/100 (!) 160/82  Pulse: 70   Resp: 20   Temp: 99 F (37.2 C)   SpO2: 93%   Weight: 154 lb 6.4 oz (70 kg)   Height: 5\' 5"  (1.651 m)    Body mass index is 25.69 kg/m. Physical Exam  Constitutional: She is oriented to person, place, and time. She appears well-developed and well-nourished.  HENT:  Head: Normocephalic and atraumatic.  Eyes: Pupils are equal, round, and reactive to light. EOM are normal.  Neck: Normal range of motion. Neck supple. No JVD present. No thyromegaly present.  Cardiovascular: Normal rate.  No murmur heard. Pulmonary/Chest: Effort normal and breath sounds normal. She has no wheezes. She has no rales.  Abdominal: Soft. Bowel sounds are normal. She exhibits no distension. There is no tenderness.  Musculoskeletal: Normal range of motion. She exhibits edema.  Trace edema BLE  Neurological: She is alert and oriented to person, place, and time. No cranial nerve deficit. She exhibits normal muscle tone. Coordination normal.  Skin: Skin is warm and dry.  Psychiatric: She has a normal mood and affect. Her behavior is normal.    Labs reviewed: Basic Metabolic Panel: Recent Labs    02/26/17 0705 09/12/17 0000  NA 136 139  K 3.9 4.1  CL 103 103  CO2 32 24  GLUCOSE 137* 133*  BUN 26* 20  CREATININE 0.92* 0.91*  CALCIUM 9.7 9.4   Liver Function Tests: Recent Labs    02/26/17 0705 09/12/17 0000  AST 12  12 14  14   ALT 11  11 12  12   BILITOT 0.7  0.7 0.7  0.7  PROT 6.1  6.1 6.0*  6.0*   No results for input(s): LIPASE, AMYLASE in the last 8760 hours. No results for input(s): AMMONIA in the last 8760 hours. CBC: Recent Labs    02/26/17 0705 09/12/17 0000  WBC 7.2 6.8  HGB 14.7 13.9  HCT 42.8 42.1  MCV 91.6 92.7  PLT 199 167   Lipid Panel: No results for input(s): CHOL, HDL, LDLCALC, TRIG, CHOLHDL, LDLDIRECT in the last 8760 hours. Lab Results  Component Value Date   HGBA1C 7.4 (H) 09/12/2017     Procedures since last visit: No results found.  Assessment/Plan HTN (hypertension) HTN, blood pressure is controlled, but not today 160/100mg Hg today, she denied HA, vision change, dizziness, chest pain, palpitation, or SOB. No O2 desaturation. The patient doesn't want more medications, continue HCT 25mg  qd, Enalapril 20mg  bid, Atenolol 50mg  qd. Monitor Bp at home. Bp 160/82 mmHg upon leaving clinic.   Type 2 diabetes mellitus (HCC) T2DM, Hgb a1c 7.4 09/13/17, continue Metformin 500mg  bid po, her goal of a1c is 8. She exercise and diet as usual.   History of heart attack Hx  of MI/CAD about 30 years ago, continue  ASA, Atorvastatin, Atenolol, Enalapril, no angina, occasionally DOE which is chronic since MI, declined echocardiogram or EKG   Cystocele, unspecified (CODE) Continue pessar    Labs/tests order: none  Next appt:  6 months  Time spend 25 minutes.

## 2017-09-25 ENCOUNTER — Encounter: Payer: Self-pay | Admitting: Internal Medicine

## 2018-01-13 ENCOUNTER — Non-Acute Institutional Stay: Payer: PPO

## 2018-01-13 VITALS — BP 120/70 | HR 81 | Temp 98.2°F | Ht 65.0 in | Wt 152.0 lb

## 2018-01-13 DIAGNOSIS — Z0000 Encounter for general adult medical examination without abnormal findings: Secondary | ICD-10-CM | POA: Diagnosis not present

## 2018-01-13 NOTE — Patient Instructions (Signed)
[name] , Thank you for taking time to come for your Medicare Wellness Visit. I appreciate your ongoing commitment to your health goals. Please review the following plan we discussed and let me know if I can assist you in the future.   Screening recommendations/referrals: Colonoscopy excluded, over age 82 Mammogram excluded, over age 56 Bone Density up to date Recommended yearly ophthalmology/optometry visit for glaucoma screening and checkup Recommended yearly dental visit for hygiene and checkup  Vaccinations: Influenza vaccine up to date Pneumococcal vaccine up to date, completed Tdap vaccine due, declined Shingles vaccine due, declined    Advanced directives: in chart  Conditions/risks identified: none  Next appointment: Mast 03/27/2018 @ 1:30pm   Preventive Care 22 Years and Older, Female Preventive care refers to lifestyle choices and visits with your health care provider that can promote health and wellness. What does preventive care include?  A yearly physical exam. This is also called an annual well check.  Dental exams once or twice a year.  Routine eye exams. Ask your health care provider how often you should have your eyes checked.  Personal lifestyle choices, including:  Daily care of your teeth and gums.  Regular physical activity.  Eating a healthy diet.  Avoiding tobacco and drug use.  Limiting alcohol use.  Practicing safe sex.  Taking low-dose aspirin every day.  Taking vitamin and mineral supplements as recommended by your health care provider. What happens during an annual well check? The services and screenings done by your health care provider during your annual well check will depend on your age, overall health, lifestyle risk factors, and family history of disease. Counseling  Your health care provider may ask you questions about your:  Alcohol use.  Tobacco use.  Drug use.  Emotional well-being.  Home and relationship  well-being.  Sexual activity.  Eating habits.  History of falls.  Memory and ability to understand (cognition).  Work and work Statistician.  Reproductive health. Screening  You may have the following tests or measurements:  Height, weight, and BMI.  Blood pressure.  Lipid and cholesterol levels. These may be checked every 5 years, or more frequently if you are over 70 years old.  Skin check.  Lung cancer screening. You may have this screening every year starting at age 23 if you have a 30-pack-year history of smoking and currently smoke or have quit within the past 15 years.  Fecal occult blood test (FOBT) of the stool. You may have this test every year starting at age 21.  Flexible sigmoidoscopy or colonoscopy. You may have a sigmoidoscopy every 5 years or a colonoscopy every 10 years starting at age 68.  Hepatitis C blood test.  Hepatitis B blood test.  Sexually transmitted disease (STD) testing.  Diabetes screening. This is done by checking your blood sugar (glucose) after you have not eaten for a while (fasting). You may have this done every 1-3 years.  Bone density scan. This is done to screen for osteoporosis. You may have this done starting at age 47.  Mammogram. This may be done every 1-2 years. Talk to your health care provider about how often you should have regular mammograms. Talk with your health care provider about your test results, treatment options, and if necessary, the need for more tests. Vaccines  Your health care provider may recommend certain vaccines, such as:  Influenza vaccine. This is recommended every year.  Tetanus, diphtheria, and acellular pertussis (Tdap, Td) vaccine. You may need a Td booster every  10 years.  Zoster vaccine. You may need this after age 76.  Pneumococcal 13-valent conjugate (PCV13) vaccine. One dose is recommended after age 82.  Pneumococcal polysaccharide (PPSV23) vaccine. One dose is recommended after age  87. Talk to your health care provider about which screenings and vaccines you need and how often you need them. This information is not intended to replace advice given to you by your health care provider. Make sure you discuss any questions you have with your health care provider. Document Released: 02/25/2015 Document Revised: 10/19/2015 Document Reviewed: 11/30/2014 Elsevier Interactive Patient Education  2017 [place] Prevention in the Home Falls can cause injuries. They can happen to people of all ages. There are many things you can do to make your home safe and to help prevent falls. What can I do on the outside of my home?  Regularly fix the edges of walkways and driveways and fix any cracks.  Remove anything that might make you trip as you walk through a door, such as a raised step or threshold.  Trim any bushes or trees on the path to your home.  Use bright outdoor lighting.  Clear any walking paths of anything that might make someone trip, such as rocks or tools.  Regularly check to see if handrails are loose or broken. Make sure that both sides of any steps have handrails.  Any raised decks and porches should have guardrails on the edges.  Have any leaves, snow, or ice cleared regularly.  Use sand or salt on walking paths during winter.  Clean up any spills in your garage right away. This includes oil or grease spills. What can I do in the bathroom?  Use night lights.  Install grab bars by the toilet and in the tub and shower. Do not use towel bars as grab bars.  Use non-skid mats or decals in the tub or shower.  If you need to sit down in the shower, use a plastic, non-slip stool.  Keep the floor dry. Clean up any water that spills on the floor as soon as it happens.  Remove soap buildup in the tub or shower regularly.  Attach bath mats securely with double-sided non-slip rug tape.  Do not have throw rugs and other things on the floor that can make  you trip. What can I do in the bedroom?  Use night lights.  Make sure that you have a light by your bed that is easy to reach.  Do not use any sheets or blankets that are too big for your bed. They should not hang down onto the floor.  Have a firm chair that has side arms. You can use this for support while you get dressed.  Do not have throw rugs and other things on the floor that can make you trip. What can I do in the kitchen?  Clean up any spills right away.  Avoid walking on wet floors.  Keep items that you use a lot in easy-to-reach places.  If you need to reach something above you, use a strong step stool that has a grab bar.  Keep electrical cords out of the way.  Do not use floor polish or wax that makes floors slippery. If you must use wax, use non-skid floor wax.  Do not have throw rugs and other things on the floor that can make you trip. What can I do with my stairs?  Do not leave any items on the stairs.  Make sure  that there are handrails on both sides of the stairs and use them. Fix handrails that are broken or loose. Make sure that handrails are as long as the stairways.  Check any carpeting to make sure that it is firmly attached to the stairs. Fix any carpet that is loose or worn.  Avoid having throw rugs at the top or bottom of the stairs. If you do have throw rugs, attach them to the floor with carpet tape.  Make sure that you have a light switch at the top of the stairs and the bottom of the stairs. If you do not have them, ask someone to add them for you. What else can I do to help prevent falls?  Wear shoes that:  Do not have high heels.  Have rubber bottoms.  Are comfortable and fit you well.  Are closed at the toe. Do not wear sandals.  If you use a stepladder:  Make sure that it is fully opened. Do not climb a closed stepladder.  Make sure that both sides of the stepladder are locked into place.  Ask someone to hold it for you, if  possible.  Clearly mark and make sure that you can see:  Any grab bars or handrails.  First and last steps.  Where the edge of each step is.  Use tools that help you move around (mobility aids) if they are needed. These include:  Canes.  Walkers.  Scooters.  Crutches.  Turn on the lights when you go into a dark area. Replace any light bulbs as soon as they burn out.  Set up your furniture so you have a clear path. Avoid moving your furniture around.  If any of your floors are uneven, fix them.  If there are any pets around you, be aware of where they are.  Review your medicines with your doctor. Some medicines can make you feel dizzy. This can increase your chance of falling. Ask your doctor what other things that you can do to help prevent falls. This information is not intended to replace advice given to you by your health care provider. Make sure you discuss any questions you have with your health care provider. Document Released: 11/25/2008 Document Revised: 07/07/2015 Document Reviewed: 03/05/2014 Elsevier Interactive Patient Education  2017 [employer].

## 2018-01-13 NOTE — Progress Notes (Signed)
Subjective:   Jessica Walters is a 84 y.o. female who presents for Medicare Annual (Subsequent) preventive examination a [facility]  Last AWV-01/10/2017    Objective:     Vitals: BP 120/70 (BP Location: Left Arm, Patient Position: Sitting)   Pulse 81   Temp 98.2 F (36.8 C) (Oral)   Ht 5\' 5"  (1.651 m)   Wt 152 lb (68.9 kg)   SpO2 96%   BMI 25.29 kg/m   Body mass index is 25.29 kg/m.  Advanced Directives 01/13/2018 01/10/2017 08/23/2016 11/17/2015  Does Patient Have a Medical Advance Directive? Yes Yes Yes Yes  Type of Industrial/product designer of [place];Living will  Does patient want to make changes to medical advance directive? No - Patient declined No - Patient declined No - Patient declined No - Patient declined  Copy of [place] in Chart? Yes - validated most recent copy scanned in chart (See row information) Yes - Yes    Tobacco Social History   Tobacco Use  Smoking Status Never Smoker  Smokeless Tobacco Never Used     Counseling given: Not Answered   Clinical Intake:  Pre-visit preparation completed: No  Pain : No/denies pain     Diabetes: Yes CBG done?: No Did pt. bring in CBG monitor from home?: No  How often do you need to have someone help you when you read instructions, pamphlets, or other written materials from your doctor or pharmacy?: 1 - Never What is the last grade level you completed in school?: 1 year college  Interpreter Needed?: No  Information entered by :: Tyson Dense, RN  Past Medical History:  Diagnosis Date  . Coronary arteriosclerosis   . Diabetes mellitus without complication (HCC)    Type 2   . Hyperlipidemia   . Hypertension   . Myocardial infarction [facility])    Past Surgical History:  Procedure Laterality Date  . APPENDECTOMY  1988   Dr. Collier Salina  . CORONARY ARTERY  BYPASS GRAFT  1990  . EYE SURGERY  2012   Dr, Vickki Muff  . KYPHOPLASTY  2015   Dr. Donivan Scull   Family History  Problem Relation Age of Onset  . Hypertension Mother   . Lung cancer Father    Social History   Socioeconomic History  . Marital status: Widowed    Spouse name: Not on file  . Number of children: Not on file  . Years of education: Not on file  . Highest education level: Not on file  Occupational History  . Not on file  Social Needs  . Financial resource strain: Not hard at all  . Food insecurity:    Worry: Never true    Inability: Never true  . Transportation needs:    Medical: No    Non-medical: No  Tobacco Use  . Smoking status: Never Smoker  . Smokeless tobacco: Never Used  Substance and Sexual Activity  . Alcohol use: Yes    Comment: 2-3 weekly  . Drug use: Not on file  . Sexual activity: Not on file  Lifestyle  . Physical activity:    Days per week: 3 days    Minutes per session: 30 min  . Stress: Not at all  Relationships  . Social connections:    Talks on phone: Once a week    Gets together: More than three times a week    Attends religious  service: Never    Active member of club or organization: No    Attends meetings of clubs or organizations: Never    Relationship status: Widowed  Other Topics Concern  . Not on file  Social History Narrative  . Not on file    Outpatient Encounter Medications as of 01/13/2018  Medication Sig  . aspirin EC 81 MG tablet Take 81 mg by mouth daily.  Marland Kitchen atenolol (TENORMIN) 50 MG tablet Take 1 tablet (50 mg total) by mouth daily.  Marland Kitchen atorvastatin (LIPITOR) 20 MG tablet Take 1 tablet (20 mg total) by mouth daily.  . Calcium Carbonate-Vitamin D3 600-400 MG-UNIT TABS Take 600 mg by mouth 2 (two) times daily with a meal.  . enalapril (VASOTEC) 20 MG tablet Take 1 tablet (20 mg total) by mouth 2 (two) times daily.  . hydrochlorothiazide (HYDRODIURIL) 25 MG tablet Take 1 tablet (25 mg total) by mouth daily.  . metFORMIN  (GLUCOPHAGE) 500 MG tablet Take 1 tablet (500 mg total) by mouth 2 (two) times daily with a meal.   No facility-administered encounter medications on file as of 01/13/2018.     Activities of Daily Living In your present state of health, do you have any difficulty performing the following activities: 01/13/2018  Hearing? N  Vision? N  Difficulty concentrating or making decisions? N  Walking or climbing stairs? N  Dressing or bathing? N  Doing errands, shopping? N  Preparing Food and eating ? N  Using the Toilet? N  In the past six months, have you accidently leaked urine? Y  Do you have problems with loss of bowel control? N  Managing your Medications? N  Managing your Finances? N  Housekeeping or managing your Housekeeping? N  Some recent data might be hidden    Patient Care Team: Blanchie Serve, MD as PCP - General (Internal Medicine) Mast, Man X, NP as Nurse Practitioner (Internal Medicine)    Assessment:   This is a routine wellness examination for Jessica Walters.  Exercise Activities and Dietary recommendations Current Exercise Habits: Home exercise routine, Type of exercise: walking, Time (Minutes): 30, Frequency (Times/Week): 3, Weekly Exercise (Minutes/Week): 90, Intensity: Mild, Exercise limited by: None identified  Goals    . Maintain Lifestyle     Starting today pt will maintain lifestyle.        Fall Risk Fall Risk  01/13/2018 01/10/2017  Falls in the past year? 0 No  Number falls in past yr: 0 -  Injury with Fall? 0 -   Is the patient's home free of loose throw rugs in walkways, pet beds, electrical cords, etc?   yes      Grab bars in the bathroom? yes      Handrails on the stairs?   yes      Adequate lighting?   yes   Depression Screen PHQ 2/9 Scores 01/13/2018 01/10/2017  PHQ - 2 Score 0 0     Cognitive Function MMSE - Mini Mental State Exam 01/10/2017  Orientation to time 5  Orientation to Place 5  Registration 3  Attention/ Calculation 5  Recall 2    Language- name 2 objects 2  Language- repeat 1  Language- follow 3 step command 3  Language- read & follow direction 1  Write a sentence 1  Copy design 1  Total score 29        Immunization History  Administered Date(s) Administered  . Influenza, Seasonal, Injecte, Preservative Fre 12/27/2010, 12/21/2011  . Influenza,inj,Quad PF,6+ Mos 01/14/2013, 12/01/2013,  01/25/2015  . Influenza-Unspecified 11/12/2017  . Pneumococcal Conjugate-13 07/27/2014  . Pneumococcal Polysaccharide-23 11/07/2004    Qualifies for Shingles Vaccine? Yes, educated and declined  Screening Tests Health Maintenance  Topic Date Due  . FOOT EXAM  [birth year]09/1933  . OPHTHALMOLOGY EXAM  07[birth year]09/1933  . TETANUS/TDAP  07/08/1944  . HEMOGLOBIN A1C  03/15/2018  . INFLUENZA VACCINE  Completed  . DEXA SCAN  Completed  . PNA vac Low Risk Adult  Completed    Cancer Screenings: Lung: Low Dose CT Chest recommended if Age 6-80 years, 30 pack-year currently smoking OR have quit w/in 15years. Patient does not qualify. Breast:  Up to date on Mammogram? Yes   Up to date of Bone Density/Dexa? Yes Colorectal: up to date  Additional Screenings:  Hepatitis C Screening: declined TDAP due: declined Diabetic [facility] exam in March at [facility]:    I have personally reviewed and addressed the Medicare Annual Wellness questionnaire and have noted the following in the patient's chart:  A. Medical and social history B. Use of alcohol, tobacco or illicit drugs  C. Current medications and supplements D. Functional ability and status E.  Nutritional status F.  Physical activity G. Advance directives H. List of other physicians I.  Hospitalizations, surgeries, and ER visits in previous 12 months J.  [place] to include hearing, vision, cognitive, depression L. Referrals and appointments - none  In addition, I have reviewed and discussed with patient certain preventive protocols, quality metrics, and best  practice recommendations. A written personalized care plan for preventive services as well as general preventive health recommendations were provided to patient.  See attached scanned questionnaire for additional information.   Signed,   Tyson Dense, RN Nurse Health Advisor  Patient concerns: None

## 2018-03-27 ENCOUNTER — Encounter: Payer: Self-pay | Admitting: Nurse Practitioner

## 2018-03-27 ENCOUNTER — Ambulatory Visit: Payer: PPO | Admitting: Nurse Practitioner

## 2018-03-27 DIAGNOSIS — E782 Mixed hyperlipidemia: Secondary | ICD-10-CM | POA: Diagnosis not present

## 2018-03-27 DIAGNOSIS — I252 Old myocardial infarction: Secondary | ICD-10-CM

## 2018-03-27 DIAGNOSIS — I10 Essential (primary) hypertension: Secondary | ICD-10-CM

## 2018-03-27 DIAGNOSIS — E1100 Type 2 diabetes mellitus with hyperosmolarity without nonketotic hyperglycemic-hyperosmolar coma (NKHHC): Secondary | ICD-10-CM

## 2018-03-27 DIAGNOSIS — E785 Hyperlipidemia, unspecified: Secondary | ICD-10-CM | POA: Diagnosis not present

## 2018-03-27 DIAGNOSIS — L239 Allergic contact dermatitis, unspecified cause: Secondary | ICD-10-CM | POA: Insufficient documentation

## 2018-03-27 MED ORDER — ATENOLOL 50 MG PO TABS: 50.0000 mg | ORAL_TABLET | Freq: Every day | ORAL | 2 refills | Status: DC

## 2018-03-27 MED ORDER — HYDROCHLOROTHIAZIDE 25 MG PO TABS: 25.0000 mg | ORAL_TABLET | Freq: Every day | ORAL | 2 refills | Status: DC

## 2018-03-27 MED ORDER — ATORVASTATIN CALCIUM 20 MG PO TABS: 20.0000 mg | ORAL_TABLET | Freq: Every day | ORAL | 2 refills | Status: DC

## 2018-03-27 MED ORDER — METFORMIN HCL 500 MG PO TABS: 500.0000 mg | ORAL_TABLET | Freq: Two times a day (BID) | ORAL | 2 refills | Status: DC

## 2018-03-27 MED ORDER — ENALAPRIL MALEATE 20 MG PO TABS: 20.0000 mg | ORAL_TABLET | Freq: Two times a day (BID) | ORAL | 2 refills | Status: DC

## 2018-03-27 NOTE — Progress Notes (Signed)
Location:   clinic [place]   Place of Service:   clinic  Provider: Marlana Latus NP  Code Status: DNR Goals of Care: [place] 01/13/2018  Does Patient Have a Medical Advance Directive? Yes  Type of Advance Directive [place]  Does patient want to make changes to medical advance directive? No - Patient declined  Copy of [place] in Chart? Yes - validated most recent copy scanned in chart (See row information)     Chief Complaint  Patient presents with  . Medical Management of Chronic Issues    78mof/u    HPI: Patient is a 85y.o. female seen today for medical management of chronic diseases.    [facility] Hx of T2DM, on Metformin 5070mbid, last Hgb a1c 7.4 09/12/17. HTN, blood pressure is controlled on HCT 2553md, Enalapril 31m18md, Atenolol 50mg28m.    Past Medical History:  Diagnosis Date  . Coronary arteriosclerosis   . Diabetes mellitus without complication (HCC)    Type 2   . Hyperlipidemia   . Hypertension   . [facility]ocardial infarction (HCC)[facility] Past Surgical History:  Procedure Laterality Date  . APPENDECTOMY  1988   Dr. PeterCollier SalinaORONARY ARTERY BYPASS GRAFT  1990  . EYE SURGERY  2012   Dr, FowleVickki MuffYPHOPLASTY  2015   Dr. DurraDonivan Scullllergies  Allergen Reactions  . Poison Ivy Extract Rash    Allergies as of 03/27/2018      Reactions   Poison Ivy Extract Rash      Medication List       Accurate as of March 27, 2018 11:59 PM. Always use your most recent med list.        aspirin EC 81 MG tablet Take 81 mg by mouth daily.   atenolol 50 MG tablet Commonly known as:  TENORMIN Take 1 tablet (50 mg total) by mouth daily.   atorvastatin 20 MG tablet Commonly known as:  LIPITOR Take 1 tablet (20 mg total) by mouth daily.   Calcium Carbonate-Vitamin D3 600-400 MG-UNIT Tabs Take 600 mg by mouth 2 (two) times daily with a meal.   enalapril 20 MG tablet Commonly known as:   VASOTEC Take 1 tablet (20 mg total) by mouth 2 (two) times daily.   hydrochlorothiazide 25 MG tablet Commonly known as:  HYDRODIURIL Take 1 tablet (25 mg total) by mouth daily.   metFORMIN 500 MG tablet Commonly known as:  GLUCOPHAGE Take 1 tablet (500 mg total) by mouth 2 (two) times daily with a meal.       Review of Systems:  Review of Systems  Constitutional: Negative for activity change, appetite change, chills, diaphoresis, fatigue, fever and unexpected weight change.  HENT: Positive for hearing loss. Negative for congestion and voice change.   Respiratory: Negative for cough, shortness of breath and wheezing.   Cardiovascular: Positive for leg swelling. Negative for chest pain and palpitations.  Gastrointestinal: Negative for abdominal distention, abdominal pain, constipation, diarrhea, nausea and vomiting.  Genitourinary: Positive for frequency. Negative for difficulty urinating, dysuria and urgency.       Pessary  Musculoskeletal: Positive for gait problem.  Skin: Negative for color change and pallor.       Topical dermatitis occasionally, unidentified allergents-1% hydrocortisone topical cream and Benadryl.   Neurological: Negative for dizziness, speech difficulty and headaches.  Psychiatric/Behavioral: Positive for sleep disturbance. Negative for  agitation and behavioral problems. The patient is not nervous/anxious.        Long standing issue    Health Maintenance  Topic Date Due  . FOOT EXAM  [birth year]09/1933  . OPHTHALMOLOGY EXAM  07[birth year]09/1935  . TETANUS/TDAP  07/08/1944  . HEMOGLOBIN A1C  03/15/2018  . INFLUENZA VACCINE  Completed  . DEXA SCAN  Completed  . PNA vac Low Risk Adult  Completed    Physical Exam: Vitals:   03/27/18 1332  BP: 110/82  Pulse: 71  Resp: 18  Temp: 98 F (36.7 C)  SpO2: 97%  Weight: 148 lb 12.8 oz (67.5 kg)  Height: 5' 5"  (1.651 m)   Body mass index is 24.76 kg/m. Physical Exam Constitutional:      General: She is not in acute  distress.    Appearance: Normal appearance. She is not ill-appearing, toxic-appearing or diaphoretic.  HENT:     Head: Normocephalic and atraumatic.     Nose: Nose normal.     Mouth/Throat:     Mouth: Mucous membranes are moist.  Eyes:     Extraocular Movements: Extraocular movements intact.     Pupils: Pupils are equal, round, and reactive to light.  Neck:     Musculoskeletal: Normal range of motion.  Cardiovascular:     Rate and Rhythm: Normal rate and regular rhythm.     Heart sounds: No murmur.  Pulmonary:     Effort: Pulmonary effort is normal.     Breath sounds: No wheezing, rhonchi or rales.  Abdominal:     General: There is no distension.     Palpations: Abdomen is soft.     Tenderness: There is no abdominal tenderness. There is no guarding or rebound.  Musculoskeletal:     Right lower leg: Edema present.     Left lower leg: Edema present.     Comments: Trace edema BLE  Skin:    General: Skin is warm and dry.  Neurological:     General: No focal deficit present.     Mental Status: She is alert and oriented to person, place, and time. Mental status is at baseline.  Psychiatric:        Mood and Affect: Mood normal.        Behavior: Behavior normal.        Thought Content: Thought content normal.        Judgment: Judgment normal.     Labs reviewed: Basic Metabolic Panel: Recent Labs    09/12/17 0000  NA 139  K 4.1  CL 103  CO2 24  GLUCOSE 133*  BUN 20  CREATININE 0.91*  CALCIUM 9.4   Liver Function Tests: Recent Labs    09/12/17 0000  AST 14  14  ALT 12  12  BILITOT 0.7  0.7  PROT 6.0*  6.0*   No results for input(s): LIPASE, AMYLASE in the last 8760 hours. No results for input(s): AMMONIA in the last 8760 hours. CBC: Recent Labs    09/12/17 0000  WBC 6.8  HGB 13.9  HCT 42.1  MCV 92.7  PLT 167   Lipid Panel: No results for input(s): CHOL, HDL, LDLCALC, TRIG, CHOLHDL, LDLDIRECT in the last 8760 hours. Lab Results  Component Value  Date   HGBA1C 7.4 (H) 09/12/2017    Procedures since last visit: No results found.  Assessment/Plan  HTN (hypertension) Blood pressure is controlled, continue HCT 4m qd, Enalapril 263mbid, Atenolol 509mid. Update CMP/eGFR   Type 2 diabetes mellitus (  [place]) Blood sugar is not well controlled, continue Metformin 542m bid, update CBC, Hgb a1c, TSH  Hyperlipidemia Continue Atorvastatin, update lipid panel.   Allergic dermatitis Topical dermatitis occasionally, unidentified allergents-1% hydrocortisone topical cream and Benadryl.     Labs/tests ordered:  TSH  CBC CMP/eGFR Hgb a1c lipid panel   Next appt:  4 months

## 2018-03-27 NOTE — Patient Instructions (Signed)
Will update CBC CMP/eGFR Hgb a1c lipid panel

## 2018-03-27 NOTE — Assessment & Plan Note (Signed)
Continue Atorvastatin, update lipid panel.  

## 2018-03-27 NOTE — Assessment & Plan Note (Addendum)
Blood sugar is not well controlled, continue Metformin 500mg  bid, update CBC, Hgb a1c, TSH

## 2018-03-27 NOTE — Assessment & Plan Note (Signed)
Blood pressure is controlled, continue HCT 67m qd, Enalapril 256mbid, Atenolol 5018mid. Update CMP/eGFR

## 2018-03-27 NOTE — Assessment & Plan Note (Signed)
Topical dermatitis occasionally, unidentified allergents-1% hydrocortisone topical cream and Benadryl.

## 2018-03-29 ENCOUNTER — Encounter: Payer: Self-pay | Admitting: Nurse Practitioner

## 2018-04-01 ENCOUNTER — Other Ambulatory Visit: Payer: Self-pay

## 2018-04-21 ENCOUNTER — Telehealth: Payer: Self-pay

## 2018-04-21 ENCOUNTER — Other Ambulatory Visit: Payer: Self-pay

## 2018-04-21 ENCOUNTER — Emergency Department (HOSPITAL_COMMUNITY)
Admission: EM | Admit: 2018-04-21 | Discharge: 2018-04-21 | Disposition: A | Discharge status: Home / Self Care | Payer: PPO | Attending: Emergency Medicine | Admitting: Emergency Medicine

## 2018-04-21 DIAGNOSIS — L299 Pruritus, unspecified: Secondary | ICD-10-CM | POA: Diagnosis not present

## 2018-04-21 DIAGNOSIS — I10 Essential (primary) hypertension: Secondary | ICD-10-CM | POA: Diagnosis not present

## 2018-04-21 DIAGNOSIS — E119 Type 2 diabetes mellitus without complications: Secondary | ICD-10-CM | POA: Insufficient documentation

## 2018-04-21 DIAGNOSIS — I252 Old myocardial infarction: Secondary | ICD-10-CM | POA: Insufficient documentation

## 2018-04-21 DIAGNOSIS — R21 Rash and other nonspecific skin eruption: Secondary | ICD-10-CM | POA: Diagnosis not present

## 2018-04-21 DIAGNOSIS — R279 Unspecified lack of coordination: Secondary | ICD-10-CM | POA: Diagnosis not present

## 2018-04-21 DIAGNOSIS — Z79899 Other long term (current) drug therapy: Secondary | ICD-10-CM | POA: Insufficient documentation

## 2018-04-21 DIAGNOSIS — Z951 Presence of aortocoronary bypass graft: Secondary | ICD-10-CM | POA: Insufficient documentation

## 2018-04-21 DIAGNOSIS — I2510 Atherosclerotic heart disease of native coronary artery without angina pectoris: Secondary | ICD-10-CM | POA: Diagnosis not present

## 2018-04-21 DIAGNOSIS — Z7982 Long term (current) use of aspirin: Secondary | ICD-10-CM | POA: Diagnosis not present

## 2018-04-21 DIAGNOSIS — Z743 Need for continuous supervision: Secondary | ICD-10-CM | POA: Diagnosis not present

## 2018-04-21 DIAGNOSIS — R0902 Hypoxemia: Secondary | ICD-10-CM | POA: Diagnosis not present

## 2018-04-21 MED ORDER — TRIAMCINOLONE ACETONIDE 0.1 % EX CREA: TOPICAL_CREAM | CUTANEOUS | 0 refills | Status: DC

## 2018-04-21 MED ORDER — DIPHENHYDRAMINE HCL 25 MG PO TABS: ORAL_TABLET | ORAL | 0 refills | Status: DC

## 2018-04-21 NOTE — ED Notes (Signed)
Bed: WA20 Expected date:  Expected time:  Means of arrival:  Comments: 58F rash

## 2018-04-21 NOTE — ED Provider Notes (Signed)
[facility] DEPT Provider Note: Georgena Spurling, MD, FACEP  CSN: [id number] MRN: [id number] ARRIVAL: 04/21/18 at [place]: [place]  04/21/18 3:25 AM Jessica Walters is a 85 y.o. female with a 3-week history of a maculopapular rash that is generalized but most prominent on the legs and upper back..  It is very pruritic and has now scabbed over on the back where she has been scratching it.  She had been taking 6 Benadryl tablets every day for 2 weeks but discontinued 2 days ago because she was worried that she should be taking Benadryl for that long.  She had also been applying triamcinolone 0.1% cream with significant relief but ran out.  She subsequently began using hydrocortisone 1% cream on her PCP's recommendation with less effectiveness.  She denies shortness of breath or other systemic symptoms.  She has a history of a vasculitis about 5 years ago causing decreased blood flow to her arms.  This was treated with methotrexate with success.  She is no longer on methotrexate.  Past Medical History:  Diagnosis Date  . Coronary arteriosclerosis   . Diabetes mellitus without complication (HCC)    Type 2   . Hyperlipidemia   . Hypertension   . Myocardial infarction [facility])     Past Surgical History:  Procedure Laterality Date  . APPENDECTOMY  1988   Dr. Collier Salina  . CORONARY ARTERY BYPASS GRAFT  1990  . EYE SURGERY  2012   Dr, Vickki Muff  . KYPHOPLASTY  2015   Dr. Donivan Scull    Family History  Problem Relation Age of Onset  . Hypertension Mother   . Lung cancer Father     Social History   Tobacco Use  . Smoking status: Never Smoker  . Smokeless tobacco: Never Used  Substance Use Topics  . Alcohol use: Yes    Comment: 2-3 weekly  . Drug use: Not on file    Prior to Admission medications   Medication Sig Start Date End Date Taking? Authorizing Provider  aspirin EC 81 MG tablet Take 81 mg by mouth daily.    [provider]  atenolol (TENORMIN) 50 MG tablet Take 1 tablet (50 mg total) by mouth daily. 03/27/18   Mast, Man X, NP  atorvastatin (LIPITOR) 20 MG tablet Take 1 tablet (20 mg total) by mouth daily. 03/27/18   Mast, Man X, NP  Calcium Carbonate-Vitamin D3 600-400 MG-UNIT TABS Take 600 mg by mouth 2 (two) times daily with a meal. 11/17/15   Mast, Man X, NP  enalapril (VASOTEC) 20 MG tablet Take 1 tablet (20 mg total) by mouth 2 (two) times daily. 03/27/18   Mast, Man X, NP  hydrochlorothiazide (HYDRODIURIL) 25 MG tablet Take 1 tablet (25 mg total) by mouth daily. 03/27/18   Mast, Man X, NP  metFORMIN (GLUCOPHAGE) 500 MG tablet Take 1 tablet (500 mg total) by mouth 2 (two) times daily with a meal. 03/27/18   Mast, Man X, NP    Allergies Poison ivy extract   REVIEW OF SYSTEMS  Negative except as noted here or in the History of Present Illness.   PHYSICAL EXAMINATION  Initial Vital Signs Blood pressure (!) 176/101, pulse 90, temperature 98.8 F (37.1 C), temperature source Oral, resp. rate 18, height 5\' 5"  (1.651 m), weight 67.1 kg, SpO2 95 %.  Examination General: Well-developed, well-nourished female in no acute distress; appears younger than age of record HENT: normocephalic; atraumatic  Eyes: pupils equal, round and reactive to light; extraocular muscles intact; bilateral pseudophakia Neck: supple Heart: regular rate and rhythm Lungs: clear to auscultation bilaterally Abdomen: soft; nondistended; nontender; bowel sounds present Extremities: No deformity; full range of motion; pulses normal except mildly decreased right radial pulse Neurologic: Awake, alert and oriented; motor function intact in all extremities and symmetric; no facial droop Skin: Warm and dry; maculopapular rash most prominent on the legs and upper back:      Psychiatric: Normal mood and affect   RESULTS  Summary of this visit's results, reviewed by myself:   EKG Interpretation  Date/Time:    Ventricular  Rate:    PR Interval:    QRS Duration:   QT Interval:    QTC Calculation:   R Axis:     Text Interpretation:        Laboratory Studies: No results found for this or any previous visit (from the past 24 hour(s)). Imaging Studies: No results found.  ED COURSE and MDM  Nursing notes and initial vitals signs, including pulse oximetry, reviewed.  Vitals:   04/21/18 0320  BP: (!) 176/101  Pulse: 90  Resp: 18  Temp: 98.8 F (37.1 C)  TempSrc: Oral  SpO2: 95%  Weight: 67.1 kg  Height: 5\' 5"  (1.651 m)   The rash has a vasculitic appearance and she was advised to contact her PCP regarding a biopsy for definitive diagnosis.  We will have her restart the Benadryl and prescribe triamcinolone 0.1% cream pending definitive diagnosis.  Will avoid systemic steroids due to diabetes.  PROCEDURES    ED DIAGNOSES     ICD-10-CM   1. Rash R21        Leira Regino, Jenny Reichmann, MD 04/21/18 [phone]

## 2018-04-21 NOTE — Telephone Encounter (Signed)
Patient was seen in the ER last night for a rash that she has x 3 weeks. Patient was told to follow-up with PCP as soon as possible.   Patient requested appointment for this Thursday 04/24/2018 with Lennie Odor Mast, NP. Patient aware Manxie does not have any availability. I offered patient appointment for today at [facility], patient refused and stated she would rather be seen at the facility.  I offered patient appointment with Dr.Gupta for Wednesday at [facility] (around the corner from [facility] and patient refused. Patient states she does not drive and being seen at [facility] is her only option. Patient scheduled appointment for the following week on 05/01/2018.  Patient informed if anything changes in her transportation availability to call the office and we will gladly get her in before Thursday May 01, 2018.   Patient agreed

## 2018-04-21 NOTE — ED Triage Notes (Signed)
Pt has scabbed rash all over her body x3 weeks that she mentioned to her PCP but nothing was done. Taking 6 benadryl everyday for 2 weeks and applying topical steroid creams everyday. No relief. Not painful, just itchy. Hx of chicken pox as a child

## 2018-05-01 ENCOUNTER — Encounter: Payer: PPO | Admitting: Nurse Practitioner

## 2018-05-30 ENCOUNTER — Ambulatory Visit (INDEPENDENT_AMBULATORY_CARE_PROVIDER_SITE_OTHER): Payer: PPO | Admitting: Adult Health

## 2018-05-30 ENCOUNTER — Encounter: Payer: Self-pay | Admitting: Adult Health

## 2018-05-30 ENCOUNTER — Other Ambulatory Visit: Payer: Self-pay

## 2018-05-30 DIAGNOSIS — L239 Allergic contact dermatitis, unspecified cause: Secondary | ICD-10-CM

## 2018-05-30 DIAGNOSIS — E119 Type 2 diabetes mellitus without complications: Secondary | ICD-10-CM | POA: Diagnosis not present

## 2018-05-30 MED ORDER — PREDNISONE 10 MG (21) PO TBPK: ORAL_TABLET | ORAL | 0 refills | Status: DC

## 2018-05-30 MED ORDER — TRIAMCINOLONE ACETONIDE 0.1 % EX CREA: TOPICAL_CREAM | CUTANEOUS | 0 refills | Status: DC

## 2018-05-30 NOTE — Progress Notes (Signed)
This service is provided via telemedicine  No vital signs collected/recorded due to the encounter was a telemedicine visit.   Location of patient (ex: home, work):  [place]   Patient consents to a telephone visit:  Yes   Location of the provider (ex: office, home):  Office   Name of any referring provider: Man X Mast NP  Names of all persons participating in the telemedicine service and their role in the encounter: Ruthell Rummage CMA, Braedin Millhouse Median-Vargas NP, [place]   Time spent on call:  Ruthell Rummage  Cma spent    Minutes with patient on the phone       DATE:  05/30/2018 MRN:  [id number]  BIRTHDAY: [birth year]   Contact Information    Name Relation Home Work Mobile   [place] Son [phone]          Chief Complaint  Patient presents with  . Acute Visit    Itchy Rash on arm and legs duration end of February,    . Medication Management    Patient is taking Bendadryl and hydrocortisone cream but does not clear out the rash. Would like to try prednisone and refill of kenalog cream     HISTORY OF PRESENT ILLNESS:   This is a 85- year-old female who is calling in for a televisit regarding her rashes. She has PMH of DM and HTN. She said that last February 2020, she bought new bed sheets. She slept on it for 4 weeks then she started having rashes on her lower legs, back and arms. She said she doesn't have much on her chest but mostly on the body parts that touches the bed sheets. She went to the ED on 04/21/18 and was prescribed Kenalog cream. She said that she is running out of the cream. She takes Metformin for her DM but doesn't check her blood sugar. She does not want prescription for a glucometer and strips. HgbA1c done on 8/19 was 7.4. She said that she doesn't order snacks and watches watch she eats.    PAST MEDICAL HISTORY:  Past Medical History:  Diagnosis Date  . Coronary arteriosclerosis   . Diabetes mellitus without complication (HCC)    Type 2    . Hyperlipidemia   . Hypertension   . Myocardial infarction [facility])      CURRENT MEDICATIONS: Reviewed  Patient's Medications  New Prescriptions   No medications on file  Previous Medications   ASPIRIN EC 81 MG TABLET    Take 81 mg by mouth daily.   ATENOLOL (TENORMIN) 50 MG TABLET    Take 1 tablet (50 mg total) by mouth daily.   ATORVASTATIN (LIPITOR) 20 MG TABLET    Take 1 tablet (20 mg total) by mouth daily.   CALCIUM CARBONATE-VITAMIN D3 600-400 MG-UNIT TABS    Take 600 mg by mouth 2 (two) times daily with a meal.   DIPHENHYDRAMINE (BENADRYL) 25 MG TABLET    Take 1 tablet every 4 hours as needed for itching.   ENALAPRIL (VASOTEC) 20 MG TABLET    Take 1 tablet (20 mg total) by mouth 2 (two) times daily.   HYDROCHLOROTHIAZIDE (HYDRODIURIL) 25 MG TABLET    Take 1 tablet (25 mg total) by mouth daily.   METFORMIN (GLUCOPHAGE) 500 MG TABLET    Take 1 tablet (500 mg total) by mouth 2 (two) times daily with a meal.   TRIAMCINOLONE CREAM (KENALOG) 0.1 %    Apply to affected area twice daily.  Modified Medications  No medications on file  Discontinued Medications   No medications on file     Allergies  Allergen Reactions  . Shrimp [Shellfish Allergy]   . Poison Ivy Extract Rash     REVIEW OF SYSTEMS:  GENERAL: no change in appetite, no fatigue, no weight changes, no fever, chills or weakness SKIN: + rashes and itching MOUTH and THROAT: Denies oral discomfort, gingival pain or bleeding, pain from teeth or hoarseness   RESPIRATORY: no cough, SOB, DOE, wheezing, hemoptysis CARDIAC: no chest pain, edema or palpitations GI: no abdominal pain, diarrhea, constipation, heart burn, nausea or vomiting GU: Denies dysuria, frequency, hematuria, incontinence, or discharge MUSCULOSKELETAL: Denies joit pain, muscle pain, back pain, restricted movement, or unusual weakness NEUROLOGICAL: Denies dizziness, syncope, numbness, or headache PSYCHIATRIC: Denies feeling of depression or anxiety. No  report of hallucinations, insomnia, paranoia, or agitation    LABS/RADIOLOGY: Labs reviewed: Basic Metabolic Panel: Recent Labs    09/12/17 0000  NA 139  K 4.1  CL 103  CO2 24  GLUCOSE 133*  BUN 20  CREATININE 0.91*  CALCIUM 9.4   Liver Function Tests: Recent Labs    09/12/17 0000  AST 14  14  ALT 12  12  BILITOT 0.7  0.7  PROT 6.0*  6.0*   CBC: Recent Labs    09/12/17 0000  WBC 6.8  HGB 13.9  HCT 42.1  MCV 92.7  PLT 167       ASSESSMENT/PLAN:  1. Allergic dermatitis - triamcinolone cream (KENALOG) 0.1 %; Apply to affected area twice daily.  Dispense: 30 g; Refill: 0 - predniSONE (STERAPRED UNI-PAK 21 TAB) 10 MG (21) TBPK tablet; Use daily as directed  Dispense: 21 tablet; Refill: 0 - Monitor skin for infection   2. Type 2 diabetes mellitus without complication, without long-term current use of insulin (HCC) Lab Results  Component Value Date   HGBA1C 7.4 (H) 09/12/2017  - continue Metformin, advised to check her blood sugars while on Prednisone but she does not want to get a glucometer and strips, monitor diet and avoid sweets    Time spent on non face to face visit:  15  The patient gave consent to this telephone visit. Explained to the patient the risk and privacy issue that was involved with this telephone call.   The patient was advised to call back and ask for an in-person evaluation if the symptoms worsen or if the condition fails to improve.   Durenda Age, NP [employer] [phone]

## 2018-05-30 NOTE — Patient Instructions (Addendum)
1. Allergic dermatitis - triamcinolone cream (KENALOG) 0.1 %; Apply to affected area twice daily.  Dispense: 30 g; Refill: 0 - predniSONE (STERAPRED UNI-PAK 21 TAB) 10 MG (21) TBPK tablet; Use daily as directed  Dispense: 21 tablet; Refill: 0  2. Type 2 diabetes mellitus without complication, without long-term current use of insulin (HCC) Lab Results  Component Value Date   HGBA1C 7.4 (H) 09/12/2017  - continue Metformin, advised to watch her diet, avoid high carbohydrates and  sweets   Advised to call back and ask for an in-person evaluation if the symptoms worsen or if the condition fails to improve.

## 2018-06-18 ENCOUNTER — Other Ambulatory Visit: Payer: Self-pay | Admitting: Adult Health

## 2018-06-18 DIAGNOSIS — L239 Allergic contact dermatitis, unspecified cause: Secondary | ICD-10-CM

## 2018-07-21 DIAGNOSIS — E1100 Type 2 diabetes mellitus with hyperosmolarity without nonketotic hyperglycemic-hyperosmolar coma (NKHHC): Secondary | ICD-10-CM | POA: Diagnosis not present

## 2018-07-21 DIAGNOSIS — E785 Hyperlipidemia, unspecified: Secondary | ICD-10-CM | POA: Diagnosis not present

## 2018-07-21 DIAGNOSIS — I10 Essential (primary) hypertension: Secondary | ICD-10-CM | POA: Diagnosis not present

## 2018-07-22 ENCOUNTER — Other Ambulatory Visit: Payer: PPO

## 2018-07-22 ENCOUNTER — Other Ambulatory Visit: Payer: Self-pay

## 2018-07-22 DIAGNOSIS — E785 Hyperlipidemia, unspecified: Secondary | ICD-10-CM

## 2018-07-22 DIAGNOSIS — I10 Essential (primary) hypertension: Secondary | ICD-10-CM

## 2018-07-22 DIAGNOSIS — E1100 Type 2 diabetes mellitus with hyperosmolarity without nonketotic hyperglycemic-hyperosmolar coma (NKHHC): Secondary | ICD-10-CM

## 2018-07-23 LAB — LIPID PANEL
Cholesterol: 197 mg/dL (ref <200)
HDL: 55 mg/dL (ref >=50)
LDL Cholesterol (Calc): 115 mg/dL (calc) — ABNORMAL HIGH
Non-HDL Cholesterol (Calc): 142 mg/dL (calc) — ABNORMAL HIGH (ref <130)
Total CHOL/HDL Ratio: 3.6 (calc) (ref <5.0)
Triglycerides: 153 mg/dL — ABNORMAL HIGH (ref <150)

## 2018-07-23 LAB — COMPLETE METABOLIC PANEL WITH GFR
AG Ratio: 2 (calc) (ref 1.0–2.5)
ALT: 10 U/L (ref 6–29)
AST: 12 U/L (ref 10–35)
Albumin: 4 g/dL (ref 3.6–5.1)
Alkaline phosphatase (APISO): 54 U/L (ref 37–153)
BUN/Creatinine Ratio: 16 (calc) (ref 6–22)
BUN: 15 mg/dL (ref 7–25)
CO2: 31 mmol/L (ref 20–32)
Calcium: 9.6 mg/dL (ref 8.6–10.4)
Chloride: 100 mmol/L (ref 98–110)
Creat: 0.91 mg/dL — ABNORMAL HIGH (ref 0.60–0.88)
GFR, Est African American: 63 mL/min/{1.73_m2} (ref >=60)
GFR, Est Non African American: 54 mL/min/{1.73_m2} — ABNORMAL LOW (ref >=60)
Globulin: 2 g/dL (calc) (ref 1.9–3.7)
Glucose, Bld: 142 mg/dL — ABNORMAL HIGH (ref 65–99)
Potassium: 3.8 mmol/L (ref 3.5–5.3)
Sodium: 138 mmol/L (ref 135–146)
Total Bilirubin: 0.7 mg/dL (ref 0.2–1.2)
Total Protein: 6 g/dL — ABNORMAL LOW (ref 6.1–8.1)

## 2018-07-23 LAB — CBC
HCT: 43.5 % (ref 35.0–45.0)
Hemoglobin: 14.6 g/dL (ref 11.7–15.5)
MCH: 31.5 pg (ref 27.0–33.0)
MCHC: 33.6 g/dL (ref 32.0–36.0)
MCV: 93.8 fL (ref 80.0–100.0)
MPV: 10.8 fL (ref 7.5–12.5)
Platelets: 192 10*3/uL (ref 140–400)
RBC: 4.64 10*6/uL (ref 3.80–5.10)
RDW: 13.1 % (ref 11.0–15.0)
WBC: 6.3 10*3/uL (ref 3.8–10.8)

## 2018-07-23 LAB — HEMOGLOBIN A1C
Hgb A1c MFr Bld: 6.8 % of total Hgb — ABNORMAL HIGH (ref <5.7)
Mean Plasma Glucose: 148 (calc)
eAG (mmol/L): 8.2 (calc)

## 2018-07-23 LAB — TSH: TSH: 4.16 mIU/L (ref 0.40–4.50)

## 2018-07-24 ENCOUNTER — Encounter: Payer: Self-pay | Admitting: Nurse Practitioner

## 2018-07-24 ENCOUNTER — Non-Acute Institutional Stay: Payer: PPO | Admitting: Nurse Practitioner

## 2018-07-24 ENCOUNTER — Other Ambulatory Visit: Payer: Self-pay

## 2018-07-24 DIAGNOSIS — E782 Mixed hyperlipidemia: Secondary | ICD-10-CM

## 2018-07-24 DIAGNOSIS — E119 Type 2 diabetes mellitus without complications: Secondary | ICD-10-CM | POA: Diagnosis not present

## 2018-07-24 DIAGNOSIS — I10 Essential (primary) hypertension: Secondary | ICD-10-CM | POA: Diagnosis not present

## 2018-07-24 DIAGNOSIS — L239 Allergic contact dermatitis, unspecified cause: Secondary | ICD-10-CM | POA: Diagnosis not present

## 2018-07-24 MED ORDER — TRIAMCINOLONE ACETONIDE 0.1 % EX CREA: TOPICAL_CREAM | CUTANEOUS | 0 refills | Status: DC

## 2018-07-24 NOTE — Assessment & Plan Note (Addendum)
Blood sugar is controlled, Metformin 500mg  bid, last Hgb a1c 6.8 07/21/18. Update Hgb a1c 4 month prior to the next appointment. May consider reduce Metformin to 500mg  qd if Hgb a1c is <7

## 2018-07-24 NOTE — Progress Notes (Signed)
Location:   Clinic [facility]   Place of Service:  Clinic (12) Provider: Marlana Latus NP  Code Status: DNR Goals of Care: [place] 07/24/2018  Does Patient Have a Medical Advance Directive? Yes  Type of Advance Directive [place]  Does patient want to make changes to medical advance directive? No - Patient declined  Copy of [place] in Chart? Yes - validated most recent copy scanned in chart (See row information)  Would patient like information on creating a medical advance directive? -     Chief Complaint  Patient presents with  . Medical Management of Chronic Issues    4 month follow up, discuss lab results, rash all over body for 4 months   . Health Maintenance    foot exam,will reschedule eye exam,patient refused tetanus vaccine     HPI: Patient is a 85 y.o. female seen today for medical management of chronic diseases.    The patient has history of T2DM, on Metformin 500mg  bid, last Hgb a1c 6.8 07/21/18. HTN, blood pressure is controlled on Enalapril 20mg  bid, HCTZ 25mg  qd, Atenolol 50mg  qd. Hyperlipidemia, LDL 115 07/21/18, not at goal, on Atorvastatin 20mg  qd. Recent allergic dermatitis, treated with oral corticosteroids, improved but not resolved.    Past Medical History:  Diagnosis Date  . Coronary arteriosclerosis   . Diabetes mellitus without complication (HCC)    Type 2   . Hyperlipidemia   . Hypertension   . Myocardial infarction [facility])     Past Surgical History:  Procedure Laterality Date  . APPENDECTOMY  1988   Dr. Collier Salina  . CORONARY ARTERY BYPASS GRAFT  1990  . EYE SURGERY  2012   Dr, Vickki Muff  . KYPHOPLASTY  2015   Dr. Donivan Scull    Allergies  Allergen Reactions  . Shrimp [Shellfish Allergy]   . Poison Ivy Extract Rash    Allergies as of 07/24/2018      Reactions   Shrimp [shellfish Allergy]    Poison Ivy Extract Rash      Medication List       Accurate as of July 24, 2018  4:35 PM. If you have any  questions, ask your nurse or doctor.        STOP taking these medications   predniSONE 10 MG (21) Tbpk tablet Commonly known as: STERAPRED UNI-PAK 21 TAB Stopped by: Zionna Homewood X Seniah Lawrence, NP     TAKE these medications   aspirin EC 81 MG tablet Take 81 mg by mouth daily.   atenolol 50 MG tablet Commonly known as: TENORMIN Take 1 tablet (50 mg total) by mouth daily.   atorvastatin 20 MG tablet Commonly known as: LIPITOR Take 1 tablet (20 mg total) by mouth daily.   Calcium Carbonate-Vitamin D3 600-400 MG-UNIT Tabs Take 600 mg by mouth 2 (two) times daily with a meal.   diphenhydrAMINE 25 MG tablet Commonly known as: BENADRYL Take 1 tablet every 4 hours as needed for itching.   enalapril 20 MG tablet Commonly known as: VASOTEC Take 1 tablet (20 mg total) by mouth 2 (two) times daily.   hydrochlorothiazide 25 MG tablet Commonly known as: HYDRODIURIL Take 1 tablet (25 mg total) by mouth daily.   metFORMIN 500 MG tablet Commonly known as: GLUCOPHAGE Take 1 tablet (500 mg total) by mouth 2 (two) times daily with a meal.   triamcinolone cream 0.1 % Commonly known as: KENALOG APPLY TO AFFECTED AREA TWICE A DAY.  Review of Systems:  Review of Systems  Constitutional: Negative for activity change, appetite change, chills, diaphoresis, fatigue, fever and unexpected weight change.       Lost weight on Prednisone.   HENT: Positive for hearing loss. Negative for congestion and voice change.   Eyes: Negative for visual disturbance.  Respiratory: Negative for cough, shortness of breath and wheezing.   Gastrointestinal: Negative for abdominal distention, abdominal pain, constipation, diarrhea, nausea and vomiting.  Genitourinary: Negative for difficulty urinating, dysuria and urgency.       Pessary  Musculoskeletal: Positive for gait problem.  Skin: Positive for rash. Negative for color change and pallor.  Neurological: Negative for dizziness, speech difficulty, weakness and  headaches.  Psychiatric/Behavioral: Negative for agitation, behavioral problems and sleep disturbance. The patient is not nervous/anxious.     Health Maintenance  Topic Date Due  . FOOT EXAM  07/09/1933  . OPHTHALMOLOGY EXAM  07/09/1933  . TETANUS/TDAP  07/24/2019 (Originally 07/08/1942)  . INFLUENZA VACCINE  09/13/2018  . HEMOGLOBIN A1C  01/20/2019  . DEXA SCAN  Completed  . PNA vac Low Risk Adult  Completed    Physical Exam: Vitals:   07/24/18 1456  BP: 136/82  Pulse: 74  Temp: 98.4 F (36.9 C)  SpO2: 94%  Weight: 140 lb (63.5 kg)  Height: 5\' 5"  (1.651 m)   Body mass index is 23.3 kg/m. Physical Exam Vitals signs and nursing note reviewed.  Constitutional:      General: She is not in acute distress.    Appearance: Normal appearance. She is normal weight. She is not ill-appearing, toxic-appearing or diaphoretic.  HENT:     Head: Normocephalic and atraumatic.     Nose: Nose normal.     Mouth/Throat:     Mouth: Mucous membranes are moist.  Eyes:     Extraocular Movements: Extraocular movements intact.     Conjunctiva/sclera: Conjunctivae normal.     Pupils: Pupils are equal, round, and reactive to light.  Neck:     Musculoskeletal: Normal range of motion and neck supple.  Cardiovascular:     Rate and Rhythm: Normal rate and regular rhythm.     Heart sounds: No murmur.  Pulmonary:     Effort: Pulmonary effort is normal.     Breath sounds: No wheezing, rhonchi or rales.  Abdominal:     General: Bowel sounds are normal.     Palpations: Abdomen is soft.     Tenderness: There is no abdominal tenderness. There is no right CVA tenderness, left CVA tenderness, guarding or rebound.  Musculoskeletal:     Right lower leg: Edema present.     Left lower leg: Edema present.     Comments: Trace edema, ambulates with walker.   Skin:    General: Skin is warm and dry.     Findings: Rash present.     Comments: Red spots papular in limbs, chest, back.   Neurological:      General: No focal deficit present.     Mental Status: She is alert and oriented to person, place, and time. Mental status is at baseline.     Cranial Nerves: No cranial nerve deficit.     Motor: No weakness.     Coordination: Coordination normal.     Gait: Gait abnormal.  Psychiatric:        Mood and Affect: Mood normal.        Behavior: Behavior normal.        Thought Content: Thought content normal.  Judgment: Judgment normal.     Labs reviewed: Basic Metabolic Panel: Recent Labs    09/12/17 0000 07/21/18 0000  NA 139 138  K 4.1 3.8  CL 103 100  CO2 24 31  GLUCOSE 133* 142*  BUN 20 15  CREATININE 0.91* 0.91*  CALCIUM 9.4 9.6  TSH  --  4.16   Liver Function Tests: Recent Labs    09/12/17 0000 07/21/18 0000  AST 14  14 12   ALT 12  12 10   BILITOT 0.7  0.7 0.7  PROT 6.0*  6.0* 6.0*   No results for input(s): LIPASE, AMYLASE in the last 8760 hours. No results for input(s): AMMONIA in the last 8760 hours. CBC: Recent Labs    09/12/17 0000 07/21/18 0000  WBC 6.8 6.3  HGB 13.9 14.6  HCT 42.1 43.5  MCV 92.7 93.8  PLT 167 192   Lipid Panel: Recent Labs    07/21/18 0000  CHOL 197  HDL 55  LDLCALC 115*  TRIG 153*  CHOLHDL 3.6   Lab Results  Component Value Date   HGBA1C 6.8 (H) 07/21/2018    Procedures since last visit: No results found.  Assessment/Plan  Hyperlipidemia LDL 115 07/21/18, may consider increase Atorvastatin to 40mg  qd.   HTN (hypertension) Blood pressure is controlled, continue  Enalapril 20mg  bid, HCTZ 25mg  qd, Atenolol 50mg  qd.   Type 2 diabetes mellitus (HCC) Blood sugar is controlled, Metformin 500mg  bid, last Hgb a1c 6.8 07/21/18. Update Hgb a1c 4 month prior to the next appointment. May consider reduce Metformin to 500mg  qd if Hgb a1c is <7  Allergic dermatitis Persisted, limbs, chest, back, ? Etiology of new dark blue sheets per patient,  continue OTC hydrocortisone cream prn, 1% triamcinolone cream prn, Benadryl.  F/u Dermatology at the facility.    Labs/tests ordered: Hgb a1c 4 months  Next appt:  4 months with Dr. Lyndel Safe

## 2018-07-24 NOTE — Assessment & Plan Note (Signed)
LDL 115 07/21/18, may consider increase Atorvastatin to 40mg  qd.

## 2018-07-24 NOTE — Assessment & Plan Note (Signed)
Blood pressure is controlled, continue  Enalapril 20mg  bid, HCTZ 25mg  qd, Atenolol 50mg  qd.

## 2018-07-24 NOTE — Patient Instructions (Addendum)
Update Hgb a1c in 4 months prior to F/u in clinic [place] with Dr Lyndel Safe.

## 2018-07-24 NOTE — Assessment & Plan Note (Addendum)
Persisted, limbs, chest, back, ? Etiology of new dark blue sheets per patient,  continue OTC hydrocortisone cream prn, 1% triamcinolone cream prn, Benadryl. F/u Dermatology at the facility.

## 2018-08-26 DIAGNOSIS — L249 Irritant contact dermatitis, unspecified cause: Secondary | ICD-10-CM | POA: Diagnosis not present

## 2018-08-26 DIAGNOSIS — D1801 Hemangioma of skin and subcutaneous tissue: Secondary | ICD-10-CM | POA: Diagnosis not present

## 2018-08-26 DIAGNOSIS — L821 Other seborrheic keratosis: Secondary | ICD-10-CM | POA: Diagnosis not present

## 2018-08-26 DIAGNOSIS — D692 Other nonthrombocytopenic purpura: Secondary | ICD-10-CM | POA: Diagnosis not present

## 2018-08-26 DIAGNOSIS — L814 Other melanin hyperpigmentation: Secondary | ICD-10-CM | POA: Diagnosis not present

## 2018-11-27 ENCOUNTER — Other Ambulatory Visit: Payer: PPO

## 2018-11-27 ENCOUNTER — Other Ambulatory Visit: Payer: Self-pay

## 2018-11-27 DIAGNOSIS — E119 Type 2 diabetes mellitus without complications: Secondary | ICD-10-CM | POA: Diagnosis not present

## 2018-11-28 LAB — HEMOGLOBIN A1C
Hgb A1c MFr Bld: 7.2 % of total Hgb — ABNORMAL HIGH (ref <5.7)
Mean Plasma Glucose: 160 (calc)
eAG (mmol/L): 8.9 (calc)

## 2018-12-04 ENCOUNTER — Telehealth: Payer: Self-pay

## 2018-12-04 ENCOUNTER — Other Ambulatory Visit: Payer: Self-pay

## 2018-12-04 ENCOUNTER — Encounter: Payer: Self-pay | Admitting: Nurse Practitioner

## 2018-12-04 ENCOUNTER — Non-Acute Institutional Stay: Payer: PPO | Admitting: Nurse Practitioner

## 2018-12-04 DIAGNOSIS — E785 Hyperlipidemia, unspecified: Secondary | ICD-10-CM | POA: Diagnosis not present

## 2018-12-04 DIAGNOSIS — E1100 Type 2 diabetes mellitus with hyperosmolarity without nonketotic hyperglycemic-hyperosmolar coma (NKHHC): Secondary | ICD-10-CM | POA: Diagnosis not present

## 2018-12-04 DIAGNOSIS — L239 Allergic contact dermatitis, unspecified cause: Secondary | ICD-10-CM | POA: Diagnosis not present

## 2018-12-04 DIAGNOSIS — I252 Old myocardial infarction: Secondary | ICD-10-CM

## 2018-12-04 DIAGNOSIS — I10 Essential (primary) hypertension: Secondary | ICD-10-CM

## 2018-12-04 MED ORDER — METFORMIN HCL 500 MG PO TABS: 500.0000 mg | ORAL_TABLET | Freq: Two times a day (BID) | ORAL | 2 refills | Status: DC

## 2018-12-04 MED ORDER — ATORVASTATIN CALCIUM 20 MG PO TABS: 20.0000 mg | ORAL_TABLET | Freq: Every day | ORAL | 2 refills | Status: DC

## 2018-12-04 MED ORDER — ATENOLOL 50 MG PO TABS: 50.0000 mg | ORAL_TABLET | Freq: Every day | ORAL | 2 refills | Status: DC

## 2018-12-04 MED ORDER — ENALAPRIL MALEATE 20 MG PO TABS: 20.0000 mg | ORAL_TABLET | Freq: Two times a day (BID) | ORAL | 2 refills | Status: DC

## 2018-12-04 MED ORDER — HYDROCHLOROTHIAZIDE 25 MG PO TABS: 25.0000 mg | ORAL_TABLET | Freq: Every day | ORAL | 2 refills | Status: DC

## 2018-12-04 MED ORDER — TRIAMCINOLONE ACETONIDE 0.1 % EX CREA: TOPICAL_CREAM | CUTANEOUS | 3 refills | Status: DC

## 2018-12-04 NOTE — Telephone Encounter (Signed)
The patient seen you back in June. Then, you advised her to return in 4 mos with Jessica Walters. She is scheduled for Oct. 30th with Jessica Walters. Keep it? She was seen today and advised to return in 4 mos.

## 2018-12-04 NOTE — Assessment & Plan Note (Signed)
Hgb a1c is trended up from 6.8 07/21/18 to 7.2 11/27/18, continue Metformin 500mg  bid, encourage diet and exercise.

## 2018-12-04 NOTE — Progress Notes (Signed)
Location:   clinic [place]   Place of Service:  Clinic (12) Provider: Marlana Latus NP  Code Status: DNR Goals of Care: [place] 07/24/2018  Does Patient Have a Medical Advance Directive? Yes  Type of Advance Directive [place]  Does patient want to make changes to medical advance directive? No - Patient declined  Copy of [place] in Chart? Yes - validated most recent copy scanned in chart (See row information)  Would patient like information on creating a medical advance directive? -     Chief Complaint  Patient presents with  . Medical Management of Chronic Issues    Patient returns for follow up and would like to diiscuss reducing her metformin. Since February she has had a rash from a dye in sheets she bought. She did the prednison back in April.   Marland Kitchen Health Maintenance    Patient declines eye and foot exam due to Pandemic. She has not gotten her flu shot because of the prednisone and steroid cream she is currently using on her skin.     HPI: Patient is a 85 y.o. female seen today for medical management of chronic diseases.     The patient has Hx of HTN, blood pressure is controlled on Atenolol 50mg  qd, Enalapril 20mg  bid, HCTZ 25mg  qd. on ASA 81mg  qd, Atorvastatin 20mg  qd. T2DM, blood sugar is controlled on Metformin 500mg  bid, last Hgb a1c 7.2 11/27/18   Past Medical History:  Diagnosis Date  . Coronary arteriosclerosis   . Diabetes mellitus without complication (HCC)    Type 2   . Hyperlipidemia   . Hypertension   . Myocardial infarction [facility])     Past Surgical History:  Procedure Laterality Date  . APPENDECTOMY  1988   Dr. Collier Salina  . CORONARY ARTERY BYPASS GRAFT  1990  . EYE SURGERY  2012   Dr, Vickki Muff  . KYPHOPLASTY  2015   Dr. Donivan Scull    Allergies  Allergen Reactions  . Shrimp [Shellfish Allergy]   . Poison Ivy Extract Rash    Allergies as of 12/04/2018      Reactions   Shrimp [shellfish Allergy]    Poison Ivy Extract Rash      Medication List       Accurate as of December 04, 2018 11:59 PM. If you have any questions, ask your nurse or doctor.        aspirin EC 81 MG tablet Take 81 mg by mouth daily.   atenolol 50 MG tablet Commonly known as: TENORMIN Take 1 tablet (50 mg total) by mouth daily.   atorvastatin 20 MG tablet Commonly known as: LIPITOR Take 1 tablet (20 mg total) by mouth daily.   Calcium Carbonate-Vitamin D3 600-400 MG-UNIT Tabs Take 600 mg by mouth 2 (two) times daily with a meal.   CeraVe Crea Apply topically. Daily as needed   diphenhydrAMINE 25 MG tablet Commonly known as: BENADRYL Take 1 tablet every 4 hours as needed for itching.   enalapril 20 MG tablet Commonly known as: VASOTEC Take 1 tablet (20 mg total) by mouth 2 (two) times daily.   hydrochlorothiazide 25 MG tablet Commonly known as: HYDRODIURIL Take 1 tablet (25 mg total) by mouth daily.   hydrocortisone cream 1 % Apply 1 application topically. Daily as needed   metFORMIN 500 MG tablet Commonly known as: GLUCOPHAGE Take 1 tablet (500 mg total) by mouth 2 (two) times daily with a meal.   triamcinolone cream  0.1 % Commonly known as: KENALOG APPLY TO AFFECTED AREA TWICE A DAY.       Review of Systems:  Review of Systems  Constitutional: Negative for activity change, appetite change, chills, diaphoresis, fatigue, fever and unexpected weight change.  HENT: Positive for hearing loss. Negative for congestion and voice change.   Respiratory: Negative for cough, shortness of breath and wheezing.   Cardiovascular: Positive for leg swelling. Negative for chest pain and palpitations.  Gastrointestinal: Negative for abdominal distention, abdominal pain, constipation and diarrhea.  Genitourinary: Negative for difficulty urinating, dysuria and urgency.       Pessary  Musculoskeletal: Positive for arthralgias and gait problem.  Skin: Negative for color change and pallor.  Neurological:  Negative for dizziness, speech difficulty, weakness and headaches.  Psychiatric/Behavioral: Negative for agitation and hallucinations. The patient is not nervous/anxious.     Health Maintenance  Topic Date Due  . FOOT EXAM  07/09/1933  . OPHTHALMOLOGY EXAM  07/09/1933  . TETANUS/TDAP  07/24/2019 (Originally 07/08/1942)  . HEMOGLOBIN A1C  05/28/2019  . DEXA SCAN  Completed  . PNA vac Low Risk Adult  Completed  . INFLUENZA VACCINE  Discontinued    Physical Exam: Vitals:   12/04/18 1325  BP: (!) 158/76  Pulse: 81  Temp: (!) 96 F (35.6 C)  SpO2: 96%  Weight: 149 lb 3.2 oz (67.7 kg)  Height: 5\' 5"  (1.651 m)   Body mass index is 24.83 kg/m. Physical Exam Vitals signs and nursing note reviewed.  Constitutional:      General: She is not in acute distress.    Appearance: Normal appearance. She is not ill-appearing or toxic-appearing.  HENT:     Head: Normocephalic and atraumatic.     Nose: Nose normal.     Mouth/Throat:     Mouth: Mucous membranes are moist.  Eyes:     Extraocular Movements: Extraocular movements intact.     Conjunctiva/sclera: Conjunctivae normal.     Pupils: Pupils are equal, round, and reactive to light.  Neck:     Musculoskeletal: Normal range of motion and neck supple.  Cardiovascular:     Rate and Rhythm: Normal rate and regular rhythm.     Heart sounds: No murmur.  Pulmonary:     Breath sounds: No wheezing, rhonchi or rales.  Abdominal:     General: Bowel sounds are normal. There is no distension.     Palpations: Abdomen is soft.     Tenderness: There is no abdominal tenderness. There is no right CVA tenderness, guarding or rebound.  Musculoskeletal:     Right lower leg: Edema present.     Left lower leg: Edema present.  Skin:    General: Skin is warm and dry.  Neurological:     General: No focal deficit present.     Mental Status: She is alert and oriented to person, place, and time. Mental status is at baseline.  Psychiatric:        Mood  and Affect: Mood normal.        Behavior: Behavior normal.        Thought Content: Thought content normal.        Judgment: Judgment normal.     Labs reviewed: Basic Metabolic Panel: Recent Labs    07/21/18 0000  NA 138  K 3.8  CL 100  CO2 31  GLUCOSE 142*  BUN 15  CREATININE 0.91*  CALCIUM 9.6  TSH 4.16   Liver Function Tests: Recent Labs    07/21/18  0000  AST 12  ALT 10  BILITOT 0.7  PROT 6.0*   No results for input(s): LIPASE, AMYLASE in the last 8760 hours. No results for input(s): AMMONIA in the last 8760 hours. CBC: Recent Labs    07/21/18 0000  WBC 6.3  HGB 14.6  HCT 43.5  MCV 93.8  PLT 192   Lipid Panel: Recent Labs    07/21/18 0000  CHOL 197  HDL 55  LDLCALC 115*  TRIG 153*  CHOLHDL 3.6   Lab Results  Component Value Date   HGBA1C 7.2 (H) 11/27/2018    Procedures since last visit: No results found.  Assessment/Plan  Type 2 diabetes mellitus (HCC) Hgb a1c is trended up from 6.8 07/21/18 to 7.2 11/27/18, continue Metformin 500mg  bid, encourage diet and exercise.   HTN (hypertension) Blood pressure is controlled, continue Enalapril 20mg  bid, Atenolol 50mg  qd, HCTZ 25mg  qd.    Labs/tests ordered:  None  Next appt:  4 months

## 2018-12-04 NOTE — Patient Instructions (Addendum)
Need better blood sugar control since Hgb a1c is trended up from 6.8 07/21/18 to 7.2 11/27/18, continue Metformin 500mg  bid, encourage diet and exercise. Follow up in clinic [facility] 4 months.

## 2018-12-04 NOTE — Assessment & Plan Note (Signed)
Blood pressure is controlled, continue Enalapril 20mg  bid, Atenolol 50mg  qd, HCTZ 25mg  qd.

## 2018-12-05 NOTE — Telephone Encounter (Signed)
Done

## 2018-12-08 ENCOUNTER — Encounter: Payer: Self-pay | Admitting: Nurse Practitioner

## 2018-12-12 ENCOUNTER — Encounter: Payer: PPO | Admitting: Internal Medicine

## 2019-04-09 ENCOUNTER — Encounter: Payer: Self-pay | Admitting: Nurse Practitioner

## 2019-04-09 ENCOUNTER — Other Ambulatory Visit: Payer: Self-pay

## 2019-04-09 ENCOUNTER — Non-Acute Institutional Stay: Payer: PPO | Admitting: Nurse Practitioner

## 2019-04-09 DIAGNOSIS — E782 Mixed hyperlipidemia: Secondary | ICD-10-CM | POA: Diagnosis not present

## 2019-04-09 DIAGNOSIS — N8110 Cystocele, unspecified: Secondary | ICD-10-CM

## 2019-04-09 DIAGNOSIS — I10 Essential (primary) hypertension: Secondary | ICD-10-CM | POA: Diagnosis not present

## 2019-04-09 DIAGNOSIS — E1100 Type 2 diabetes mellitus with hyperosmolarity without nonketotic hyperglycemic-hyperosmolar coma (NKHHC): Secondary | ICD-10-CM

## 2019-04-09 NOTE — Assessment & Plan Note (Addendum)
Blood sugar is loose controlled, Hgb a1c 7.2 11/27/18, continue Metformin 500mg  bid, update Hgb a1c, CBC/diff. The patient desires seeing Ophthalmology prn.

## 2019-04-09 NOTE — Assessment & Plan Note (Signed)
Blood pressure is controlled, continue Enalapril 60m qd, Atenolol 516mqd, HCTZ 2533md, update CMP/eGFR

## 2019-04-09 NOTE — Patient Instructions (Signed)
CBC/diff, CMP/eGFR, lipid panel, Hgb a1c prior to 3 months f/u appointment with Dr. Lyndel Safe.

## 2019-04-09 NOTE — Progress Notes (Signed)
Location:   clinic [place]   Place of Service:  Clinic (12) Provider: Marlana Latus NP  Code Status: DNR Goals of Care: [place] 07/24/2018  Does Patient Have a Medical Advance Directive? Yes  Type of Advance Directive [place]  Does patient want to make changes to medical advance directive? No - Patient declined  Copy of [place] in Chart? Yes - validated most recent copy scanned in chart (See row information)  Would patient like information on creating a medical advance directive? -     Chief Complaint  Patient presents with  . Medical Management of Chronic Issues    4 month follow up. Patient states her rash is getting better.  Marland Kitchen Health Maintenance    Foot and eye exam.    HPI: Patient is a 86 y.o. female seen today for medical management of chronic diseases.    The patient has history of HTN, blood pressure is controlled on Atenolol 72m qd, Enalapril 27mqd, HCTZ 2540md. Hyperlipidemia, on Atrovastatin 56m13m, LDL 115 07/2018.  T2DM, stable, on Metformin 500mg76m, Hgb a1c 7.2 11/27/18   Past Medical History:  Diagnosis Date  . Coronary arteriosclerosis   . Diabetes mellitus without complication (HCC)    Type 2   . Hyperlipidemia   . Hypertension   . Myocardial infarction (HCC)[facility] Past Surgical History:  Procedure Laterality Date  . APPENDECTOMY  1988   Dr. PeterCollier SalinaORONARY ARTERY BYPASS GRAFT  1990  . EYE SURGERY  2012   Dr, FowleVickki MuffYPHOPLASTY  2015   Dr. DurraDonivan Scullllergies  Allergen Reactions  . Shrimp [Shellfish Allergy]   . Poison Ivy Extract Rash    Allergies as of 04/09/2019      Reactions   Shrimp [shellfish Allergy]    Poison Ivy Extract Rash      Medication List       Accurate as of April 09, 2019 11:59 PM. If you have any questions, ask your nurse or doctor.        aspirin EC 81 MG tablet Take 81 mg by mouth daily.   atenolol 50 MG tablet Commonly known as:  TENORMIN Take 1 tablet (50 mg total) by mouth daily.   atorvastatin 20 MG tablet Commonly known as: LIPITOR Take 1 tablet (20 mg total) by mouth daily.   Calcium Carbonate-Vitamin D3 600-400 MG-UNIT Tabs Take 600 mg by mouth 2 (two) times daily with a meal.   CeraVe Crea Apply topically. Daily as needed   diphenhydrAMINE 25 MG tablet Commonly known as: BENADRYL Take 1 tablet every 4 hours as needed for itching.   enalapril 20 MG tablet Commonly known as: VASOTEC Take 1 tablet (20 mg total) by mouth 2 (two) times daily.   hydrochlorothiazide 25 MG tablet Commonly known as: HYDRODIURIL Take 1 tablet (25 mg total) by mouth daily.   hydrocortisone cream 1 % Apply 1 application topically. Daily as needed   metFORMIN 500 MG tablet Commonly known as: GLUCOPHAGE Take 1 tablet (500 mg total) by mouth 2 (two) times daily with a meal.   triamcinolone cream 0.1 % Commonly known as: KENALOG APPLY TO AFFECTED AREA TWICE A DAY.       Review of Systems:  Review of Systems  Constitutional: Negative for activity change, appetite change, chills, diaphoresis, fatigue, fever and unexpected weight change.  HENT: Positive for hearing loss. Negative for congestion and voice  change.   Respiratory: Negative for cough, shortness of breath and wheezing.   Cardiovascular: Positive for leg swelling. Negative for chest pain and palpitations.  Gastrointestinal: Negative for abdominal distention, abdominal pain, constipation and diarrhea.  Genitourinary: Negative for difficulty urinating, dysuria and urgency.       Pessary, nocturnal urination x1 average  Musculoskeletal: Positive for arthralgias and gait problem.  Skin: Negative for color change and pallor.  Neurological: Negative for dizziness, speech difficulty, weakness and headaches.  Psychiatric/Behavioral: Negative for agitation, hallucinations and self-injury. The patient is not nervous/anxious.        Sometimes stay awake after bathroom,  but taking a nap during day.     Health Maintenance  Topic Date Due  . FOOT EXAM  07/09/1933  . OPHTHALMOLOGY EXAM  07/09/1933  . TETANUS/TDAP  07/24/2019 (Originally 07/08/1942)  . HEMOGLOBIN A1C  05/28/2019  . DEXA SCAN  Completed  . PNA vac Low Risk Adult  Completed  . INFLUENZA VACCINE  Discontinued    Physical Exam: Vitals:   04/09/19 1251  BP: (!) 156/80  Pulse: 66  Temp: (!) 97.3 F (36.3 C)  SpO2: 97%  Weight: 148 lb 6.4 oz (67.3 kg)  Height: 5' 5"  (1.651 m)   Body mass index is 24.7 kg/m. Physical Exam Vitals and nursing note reviewed.  Constitutional:      General: She is not in acute distress.    Appearance: Normal appearance. She is not ill-appearing or toxic-appearing.  HENT:     Head: Normocephalic and atraumatic.     Nose: Nose normal.     Mouth/Throat:     Mouth: Mucous membranes are moist.  Eyes:     Extraocular Movements: Extraocular movements intact.     Conjunctiva/sclera: Conjunctivae normal.     Pupils: Pupils are equal, round, and reactive to light.  Cardiovascular:     Rate and Rhythm: Normal rate and regular rhythm.     Heart sounds: No murmur.  Pulmonary:     Breath sounds: No wheezing, rhonchi or rales.  Abdominal:     General: Bowel sounds are normal. There is no distension.     Palpations: Abdomen is soft.     Tenderness: There is no abdominal tenderness. There is no right CVA tenderness, guarding or rebound.  Musculoskeletal:     Cervical back: Normal range of motion and neck supple.     Right lower leg: Edema present.     Left lower leg: Edema present.     Comments: Trace edema in ankles, hardly noticeable.   Skin:    General: Skin is warm and dry.  Neurological:     General: No focal deficit present.     Mental Status: She is alert and oriented to person, place, and time. Mental status is at baseline.  Psychiatric:        Mood and Affect: Mood normal.        Behavior: Behavior normal.        Thought Content: Thought content  normal.        Judgment: Judgment normal.     Labs reviewed: Basic Metabolic Panel: Recent Labs    07/21/18 0000  NA 138  K 3.8  CL 100  CO2 31  GLUCOSE 142*  BUN 15  CREATININE 0.91*  CALCIUM 9.6  TSH 4.16   Liver Function Tests: Recent Labs    07/21/18 0000  AST 12  ALT 10  BILITOT 0.7  PROT 6.0*   No results for input(s): LIPASE,  AMYLASE in the last 8760 hours. No results for input(s): AMMONIA in the last 8760 hours. CBC: Recent Labs    07/21/18 0000  WBC 6.3  HGB 14.6  HCT 43.5  MCV 93.8  PLT 192   Lipid Panel: Recent Labs    07/21/18 0000  CHOL 197  HDL 55  LDLCALC 115*  TRIG 153*  CHOLHDL 3.6   Lab Results  Component Value Date   HGBA1C 7.2 (H) 11/27/2018    Procedures since last visit: No results found.  Assessment/Plan  HTN (hypertension) Blood pressure is controlled, continue Enalapril 84m qd, Atenolol 520mqd, HCTZ 2590md, update CMP/eGFR  Type 2 diabetes mellitus ([place] sugar is loose controlled, Hgb a1c 7.2 11/27/18, continue Metformin 500m35md, update Hgb a1c, CBC/diff. The patient desires seeing Ophthalmology prn.   Hyperlipidemia HDL 115 07/2018, continue Atorvastatin 20mg67m update lipid panel.   Cystocele, unspecified (CODE) Pessary self about every 6 months, prn Urology.    Labs/tests ordered: CBC/diff, CMP/eGFR, lipid panel, Hgb a1c.   Next appt:  F/u Dr. GuptaLyndel Safenths

## 2019-04-09 NOTE — Assessment & Plan Note (Signed)
Pessary self about every 6 months, prn Urology.

## 2019-04-09 NOTE — Assessment & Plan Note (Signed)
HDL 115 07/2018, continue Atorvastatin 20mg  qd, update lipid panel.

## 2019-07-07 ENCOUNTER — Other Ambulatory Visit: Payer: Self-pay

## 2019-07-07 DIAGNOSIS — I10 Essential (primary) hypertension: Secondary | ICD-10-CM

## 2019-07-07 DIAGNOSIS — E1100 Type 2 diabetes mellitus with hyperosmolarity without nonketotic hyperglycemic-hyperosmolar coma (NKHHC): Secondary | ICD-10-CM | POA: Diagnosis not present

## 2019-07-07 DIAGNOSIS — E782 Mixed hyperlipidemia: Secondary | ICD-10-CM

## 2019-07-08 LAB — HEMOGLOBIN A1C
Hgb A1c MFr Bld: 6.7 % of total Hgb — ABNORMAL HIGH (ref <5.7)
Mean Plasma Glucose: 146 (calc)
eAG (mmol/L): 8.1 (calc)

## 2019-07-08 LAB — LIPID PANEL
Cholesterol: 126 mg/dL (ref <200)
HDL: 53 mg/dL (ref >=50)
LDL Cholesterol (Calc): 53 mg/dL (calc)
Non-HDL Cholesterol (Calc): 73 mg/dL (calc) (ref <130)
Total CHOL/HDL Ratio: 2.4 (calc) (ref <5.0)
Triglycerides: 117 mg/dL (ref <150)

## 2019-07-08 LAB — COMPLETE METABOLIC PANEL WITH GFR
AG Ratio: 2.1 (calc) (ref 1.0–2.5)
ALT: 9 U/L (ref 6–29)
AST: 12 U/L (ref 10–35)
Albumin: 4.1 g/dL (ref 3.6–5.1)
Alkaline phosphatase (APISO): 64 U/L (ref 37–153)
BUN/Creatinine Ratio: 25 (calc) — ABNORMAL HIGH (ref 6–22)
BUN: 24 mg/dL (ref 7–25)
CO2: 33 mmol/L — ABNORMAL HIGH (ref 20–32)
Calcium: 9.8 mg/dL (ref 8.6–10.4)
Chloride: 99 mmol/L (ref 98–110)
Creat: 0.95 mg/dL — ABNORMAL HIGH (ref 0.60–0.88)
GFR, Est African American: 60 mL/min/{1.73_m2} (ref >=60)
GFR, Est Non African American: 52 mL/min/{1.73_m2} — ABNORMAL LOW (ref >=60)
Globulin: 2 g/dL (calc) (ref 1.9–3.7)
Glucose, Bld: 145 mg/dL — ABNORMAL HIGH (ref 65–99)
Potassium: 4.1 mmol/L (ref 3.5–5.3)
Sodium: 139 mmol/L (ref 135–146)
Total Bilirubin: 0.9 mg/dL (ref 0.2–1.2)
Total Protein: 6.1 g/dL (ref 6.1–8.1)

## 2019-07-08 LAB — CBC WITH DIFFERENTIAL/PLATELET
Absolute Monocytes: 641 cells/uL (ref 200–950)
Basophils Absolute: 62 cells/uL (ref 0–200)
Basophils Relative: 0.7 %
Eosinophils Absolute: 249 cells/uL (ref 15–500)
Eosinophils Relative: 2.8 %
HCT: 44 % (ref 35.0–45.0)
Hemoglobin: 14.4 g/dL (ref 11.7–15.5)
Lymphs Abs: 1317 cells/uL (ref 850–3900)
MCH: 30.4 pg (ref 27.0–33.0)
MCHC: 32.7 g/dL (ref 32.0–36.0)
MCV: 93 fL (ref 80.0–100.0)
MPV: 11.1 fL (ref 7.5–12.5)
Monocytes Relative: 7.2 %
Neutro Abs: 6631 cells/uL (ref 1500–7800)
Neutrophils Relative %: 74.5 %
Platelets: 177 10*3/uL (ref 140–400)
RBC: 4.73 10*6/uL (ref 3.80–5.10)
RDW: 12.2 % (ref 11.0–15.0)
Total Lymphocyte: 14.8 %
WBC: 8.9 10*3/uL (ref 3.8–10.8)

## 2019-07-10 ENCOUNTER — Non-Acute Institutional Stay: Payer: PPO | Admitting: Internal Medicine

## 2019-07-10 ENCOUNTER — Other Ambulatory Visit: Payer: Self-pay

## 2019-07-10 ENCOUNTER — Encounter: Payer: Self-pay | Admitting: Internal Medicine

## 2019-07-10 DIAGNOSIS — L239 Allergic contact dermatitis, unspecified cause: Secondary | ICD-10-CM | POA: Diagnosis not present

## 2019-07-10 DIAGNOSIS — I252 Old myocardial infarction: Secondary | ICD-10-CM | POA: Diagnosis not present

## 2019-07-10 DIAGNOSIS — E785 Hyperlipidemia, unspecified: Secondary | ICD-10-CM | POA: Diagnosis not present

## 2019-07-10 DIAGNOSIS — I10 Essential (primary) hypertension: Secondary | ICD-10-CM

## 2019-07-10 DIAGNOSIS — E1100 Type 2 diabetes mellitus with hyperosmolarity without nonketotic hyperglycemic-hyperosmolar coma (NKHHC): Secondary | ICD-10-CM

## 2019-07-10 MED ORDER — ENALAPRIL MALEATE 20 MG PO TABS: 20.0000 mg | ORAL_TABLET | Freq: Two times a day (BID) | ORAL | 2 refills | Status: DC

## 2019-07-10 MED ORDER — TRIAMCINOLONE ACETONIDE 0.1 % EX CREA: TOPICAL_CREAM | CUTANEOUS | 3 refills | Status: DC

## 2019-07-10 MED ORDER — HYDROCHLOROTHIAZIDE 25 MG PO TABS: 25.0000 mg | ORAL_TABLET | Freq: Every day | ORAL | 2 refills | Status: DC

## 2019-07-10 MED ORDER — ATENOLOL 50 MG PO TABS: 50.0000 mg | ORAL_TABLET | Freq: Every day | ORAL | 2 refills | Status: DC

## 2019-07-10 MED ORDER — ATORVASTATIN CALCIUM 20 MG PO TABS: 20.0000 mg | ORAL_TABLET | Freq: Every day | ORAL | 2 refills | Status: DC

## 2019-07-10 MED ORDER — METFORMIN HCL 500 MG PO TABS: 500.0000 mg | ORAL_TABLET | Freq: Two times a day (BID) | ORAL | 2 refills | Status: DC

## 2019-07-10 NOTE — Progress Notes (Signed)
Location:  [place] of Service:  Clinic (12)  Provider:   Code Status:  Goals of Care:  Advanced Directives 07/24/2018  Does Patient Have a Medical Advance Directive? Yes  Type of Advance Directive [place]  Does patient want to make changes to medical advance directive? No - Patient declined  Copy of [place] in Chart? Yes - validated most recent copy scanned in chart (See row information)  Would patient like information on creating a medical advance directive? -     Chief Complaint  Patient presents with  . Medical Management of Chronic Issues    3 month follow. No concerns.    HPI: Patient is a 86 y.o. female seen today for medical management of chronic diseases.    Patient has h/o Hypertension, Hyperlipidemia, Type 2 Diabetes, Allergic Dermatitis  Allergic dermatitis Patient had allergic reaction to her new sheets She was treated with low-dose prednisone and triamcinolone. She says she is doing much better and rash is completely resolved Hypertension Blood pressure slightly high today. Patient states that she usually runs less than 140 at home Diabetes mellitus A1c has come down. She says she is watching her diet  Patient did not have any acute complaints. She lives by herself in apartment has a son who is her POA. Does not drive anymore. Does not use cane and walker. No falls. Independent in her ADLs and IADLs  Past Medical History:  Diagnosis Date  . Coronary arteriosclerosis   . Diabetes mellitus without complication (HCC)    Type 2   . Hyperlipidemia   . Hypertension   . Myocardial infarction [facility])     Past Surgical History:  Procedure Laterality Date  . APPENDECTOMY  1988   Dr. Collier Salina  . CORONARY ARTERY BYPASS GRAFT  1990  . EYE SURGERY  2012   Dr, Vickki Muff  . KYPHOPLASTY  2015   Dr. Donivan Scull    Allergies  Allergen Reactions  . Shrimp [Shellfish Allergy]   . Poison Ivy Extract Rash     Outpatient Encounter Medications as of 07/10/2019  Medication Sig  . aspirin EC 81 MG tablet Take 81 mg by mouth daily.  Marland Kitchen atenolol (TENORMIN) 50 MG tablet Take 1 tablet (50 mg total) by mouth daily.  Marland Kitchen atorvastatin (LIPITOR) 20 MG tablet Take 1 tablet (20 mg total) by mouth daily.  . Calcium Carbonate-Vitamin D3 600-400 MG-UNIT TABS Take 600 mg by mouth 2 (two) times daily with a meal.  . Emollient (CERAVE) CREA Apply topically. Daily as needed  . enalapril (VASOTEC) 20 MG tablet Take 1 tablet (20 mg total) by mouth 2 (two) times daily.  . hydrochlorothiazide (HYDRODIURIL) 25 MG tablet Take 1 tablet (25 mg total) by mouth daily.  . hydrocortisone cream 1 % Apply 1 application topically. Daily as needed  . metFORMIN (GLUCOPHAGE) 500 MG tablet Take 1 tablet (500 mg total) by mouth 2 (two) times daily with a meal.  . triamcinolone cream (KENALOG) 0.1 % APPLY TO AFFECTED AREA TWICE A DAY.  . [DISCONTINUED] diphenhydrAMINE (BENADRYL) 25 MG tablet Take 1 tablet every 4 hours as needed for itching.   No facility-administered encounter medications on file as of 07/10/2019.    Review of Systems:  Review of Systems  Review of Systems  Constitutional: Negative for activity change, appetite change, chills, diaphoresis, fatigue and fever.  HENT: Negative for mouth sores, postnasal drip, rhinorrhea, sinus pain and sore throat.   Respiratory: Negative  for apnea, cough, chest tightness, shortness of breath and wheezing.   Cardiovascular: Negative for chest pain, palpitations and leg swelling.  Gastrointestinal: Negative for abdominal distention, abdominal pain, constipation, diarrhea, nausea and vomiting.  Genitourinary: Negative for dysuria and frequency.  Musculoskeletal: Negative for arthralgias, joint swelling and myalgias.  Skin: Negative for rash.  Neurological: Negative for dizziness, syncope, weakness, light-headedness and numbness.  Psychiatric/Behavioral: Negative for behavioral problems,  confusion and sleep disturbance.     Health Maintenance  Topic Date Due  . FOOT EXAM  Never done  . OPHTHALMOLOGY EXAM  Never done  . TETANUS/TDAP  07/24/2019 (Originally 07/08/1942)  . HEMOGLOBIN A1C  01/07/2020  . DEXA SCAN  Completed  . COVID-19 Vaccine  Completed  . PNA vac Low Risk Adult  Completed  . INFLUENZA VACCINE  Discontinued    Physical Exam: Vitals:   07/10/19 0918  Weight: 147 lb 12.8 oz (67 kg)  Height: 5\' 5"  (1.651 m)   Body mass index is 24.6 kg/m. Physical Exam  Constitutional: Oriented to person, place, and time. Well-developed and well-nourished.  HENT:  Head: Normocephalic.  Ear mild wax in her Right ear Mouth/Throat: Oropharynx is clear and moist.  Eyes: Pupils are equal, round, and reactive to light.  Neck: Neck supple.  Cardiovascular: Normal rate and normal heart sounds.  No murmur heard. Pulmonary/Chest: Effort normal and breath sounds normal. No respiratory distress. No wheezes. She has no rales.  Abdominal: Soft. Bowel sounds are normal. No distension. There is no tenderness. There is no rebound.  Musculoskeletal: Chronic Venous changes with mild edema Bilateral Lymphadenopathy: none Neurological: Alert and oriented to person, place, and time.  Gait Stable Skin: Skin is warm and dry.  Psychiatric: Normal mood and affect. Behavior is normal. Thought content normal.    Labs reviewed: Basic Metabolic Panel: Recent Labs    07/21/18 0000 07/07/19 0700  NA 138 139  K 3.8 4.1  CL 100 99  CO2 31 33*  GLUCOSE 142* 145*  BUN 15 24  CREATININE 0.91* 0.95*  CALCIUM 9.6 9.8  TSH 4.16  --    Liver Function Tests: Recent Labs    07/21/18 0000 07/07/19 0700  AST 12 12  ALT 10 9  BILITOT 0.7 0.9  PROT 6.0* 6.1   No results for input(s): LIPASE, AMYLASE in the last 8760 hours. No results for input(s): AMMONIA in the last 8760 hours. CBC: Recent Labs    07/21/18 0000 07/07/19 0700  WBC 6.3 8.9  NEUTROABS  --  6,631  HGB 14.6 14.4   HCT 43.5 44.0  MCV 93.8 93.0  PLT 192 177   Lipid Panel: Recent Labs    07/21/18 0000 07/07/19 0700  CHOL 197 126  HDL 55 53  LDLCALC 115* 53  TRIG 153* 117  CHOLHDL 3.6 2.4   Lab Results  Component Value Date   HGBA1C 6.7 (H) 07/07/2019    Procedures since last visit: No results found.  Assessment/Plan Essential hypertension - Plan: atenolol (TENORMIN) 50 MG tablet, hydrochlorothiazide (HYDRODIURIL) 25 MG tablet Creat Stable  Hyperlipidemia, unspecified hyperlipidemia type - Plan: atorvastatin (LIPITOR) 20 MG tablet LDL at goal  History of heart attack - Plan: enalapril (VASOTEC) 20 MG tablet  Type 2 diabetes mellitus   - Plan: metFORMIN (GLUCOPHAGE) 500 MG tablet A1C is less then 7 Does not want to see Ophthalmologist anymore. Says no Vision problems Does not see Podiatrist or Dentist  Allergic dermatitis - Plan: triamcinolone cream (KENALOG) 0.1 % PRN  Does  not want TDAP right now Is going to think about Shingrix   Labs/tests ordered:  * No order type specified * Next appt:  Visit date not found   Total time spent in this patient care encounter was  45_  minutes; greater than 50% of the visit spent counseling patient and staff, reviewing records , Labs and coordinating care for problems addressed at this encounter.

## 2019-07-14 ENCOUNTER — Telehealth: Payer: Self-pay

## 2019-07-14 NOTE — Telephone Encounter (Signed)
The patient stated she was told to report BP readings.  Patient states that she meditates and that the low values each day are after she meditates.  Patient just gave me high and low values. I could not discern from her if the high was from the same time every day, but was able to ascertain that she is taking her BP after her medication.  Sat 151/78  137/77 Sun 152/81 139/78 Mon 147/79 138/80   She also states you asked what she was going difference because her cholesterol level had dropped.  She states she was trying to eat better in an attempt to lower her sugars. She states the last month she had been eating Cheerios for breakfast the last month.

## 2019-07-15 NOTE — Telephone Encounter (Signed)
Thanks for leeting [place] know

## 2019-08-20 ENCOUNTER — Encounter: Payer: Self-pay | Admitting: Nurse Practitioner

## 2019-08-20 ENCOUNTER — Other Ambulatory Visit: Payer: Self-pay

## 2019-08-20 ENCOUNTER — Non-Acute Institutional Stay: Payer: PPO | Admitting: Nurse Practitioner

## 2019-08-20 VITALS — BP 134/80 | HR 64 | Temp 96.2°F | Ht 65.0 in | Wt 146.4 lb

## 2019-08-20 DIAGNOSIS — Z0000 Encounter for general adult medical examination without abnormal findings: Secondary | ICD-10-CM | POA: Diagnosis not present

## 2019-08-20 NOTE — Patient Instructions (Signed)
Jessica Walters , Thank you for taking time to come for your Medicare Wellness Visit. I appreciate your ongoing commitment to your health goals. Please review the following plan we discussed and let me know if I can assist you in the future.   Screening recommendations/referrals: Colonoscopy aged out Mammogram aged out Bone Density aged out Recommended yearly ophthalmology/optometry visit for glaucoma screening and checkup Recommended yearly dental visit for hygiene and checkup  Vaccinations: Influenza vaccine up to date Pneumococcal vaccine up to date Tdap vaccine declined Shingles vaccine declined  Advanced directives: living will, HPOA Conditions/risks identified: yes  Next appointment: 1 year   Preventive Care 35 Years and Older, Female Preventive care refers to lifestyle choices and visits with your health care provider that can promote health and wellness. What does preventive care include?  A yearly physical exam. This is also called an annual well check.  Dental exams once or twice a year.  Routine eye exams. Ask your health care provider how often you should have your eyes checked.  Personal lifestyle choices, including:  Daily care of your teeth and gums.  Regular physical activity.  Eating a healthy diet.  Avoiding tobacco and drug use.  Limiting alcohol use.  Practicing safe sex.  Taking low-dose aspirin every day.  Taking vitamin and mineral supplements as recommended by your health care provider. What happens during an annual well check? The services and screenings done by your health care provider during your annual well check will depend on your age, overall health, lifestyle risk factors, and family history of disease. Counseling  Your health care provider may ask you questions about your:  Alcohol use.  Tobacco use.  Drug use.  Emotional well-being.  Home and relationship well-being.  Sexual activity.  Eating habits.  History of  falls.  Memory and ability to understand (cognition).  Work and work Statistician.  Reproductive health. Screening  You may have the following tests or measurements:  Height, weight, and BMI.  Blood pressure.  Lipid and cholesterol levels. These may be checked every 5 years, or more frequently if you are over 40 years old.  Skin check.  Lung cancer screening. You may have this screening every year starting at age 55 if you have a 30-pack-year history of smoking and currently smoke or have quit within the past 15 years.  Fecal occult blood test (FOBT) of the stool. You may have this test every year starting at age 61.  Flexible sigmoidoscopy or colonoscopy. You may have a sigmoidoscopy every 5 years or a colonoscopy every 10 years starting at age 58.  Hepatitis C blood test.  Hepatitis B blood test.  Sexually transmitted disease (STD) testing.  Diabetes screening. This is done by checking your blood sugar (glucose) after you have not eaten for a while (fasting). You may have this done every 1-3 years.  Bone density scan. This is done to screen for osteoporosis. You may have this done starting at age 41.  Mammogram. This may be done every 1-2 years. Talk to your health care provider about how often you should have regular mammograms. Talk with your health care provider about your test results, treatment options, and if necessary, the need for more tests. Vaccines  Your health care provider may recommend certain vaccines, such as:  Influenza vaccine. This is recommended every year.  Tetanus, diphtheria, and acellular pertussis (Tdap, Td) vaccine. You may need a Td booster every 10 years.  Zoster vaccine. You may need this after age 38.  Pneumococcal 13-valent conjugate (PCV13) vaccine. One dose is recommended after age 40.  Pneumococcal polysaccharide (PPSV23) vaccine. One dose is recommended after age 46. Talk to your health care provider about which screenings and  vaccines you need and how often you need them. This information is not intended to replace advice given to you by your health care provider. Make sure you discuss any questions you have with your health care provider. Document Released: 02/25/2015 Document Revised: 10/19/2015 Document Reviewed: 11/30/2014 Elsevier Interactive Patient Education  2017 [place] Prevention in the Home Falls can cause injuries. They can happen to people of all ages. There are many things you can do to make your home safe and to help prevent falls. What can I do on the outside of my home?  Regularly fix the edges of walkways and driveways and fix any cracks.  Remove anything that might make you trip as you walk through a door, such as a raised step or threshold.  Trim any bushes or trees on the path to your home.  Use bright outdoor lighting.  Clear any walking paths of anything that might make someone trip, such as rocks or tools.  Regularly check to see if handrails are loose or broken. Make sure that both sides of any steps have handrails.  Any raised decks and porches should have guardrails on the edges.  Have any leaves, snow, or ice cleared regularly.  Use sand or salt on walking paths during winter.  Clean up any spills in your garage right away. This includes oil or grease spills. What can I do in the bathroom?  Use night lights.  Install grab bars by the toilet and in the tub and shower. Do not use towel bars as grab bars.  Use non-skid mats or decals in the tub or shower.  If you need to sit down in the shower, use a plastic, non-slip stool.  Keep the floor dry. Clean up any water that spills on the floor as soon as it happens.  Remove soap buildup in the tub or shower regularly.  Attach bath mats securely with double-sided non-slip rug tape.  Do not have throw rugs and other things on the floor that can make you trip. What can I do in the bedroom?  Use night  lights.  Make sure that you have a light by your bed that is easy to reach.  Do not use any sheets or blankets that are too big for your bed. They should not hang down onto the floor.  Have a firm chair that has side arms. You can use this for support while you get dressed.  Do not have throw rugs and other things on the floor that can make you trip. What can I do in the kitchen?  Clean up any spills right away.  Avoid walking on wet floors.  Keep items that you use a lot in easy-to-reach places.  If you need to reach something above you, use a strong step stool that has a grab bar.  Keep electrical cords out of the way.  Do not use floor polish or wax that makes floors slippery. If you must use wax, use non-skid floor wax.  Do not have throw rugs and other things on the floor that can make you trip. What can I do with my stairs?  Do not leave any items on the stairs.  Make sure that there are handrails on both sides of the stairs and use them.  Fix handrails that are broken or loose. Make sure that handrails are as long as the stairways.  Check any carpeting to make sure that it is firmly attached to the stairs. Fix any carpet that is loose or worn.  Avoid having throw rugs at the top or bottom of the stairs. If you do have throw rugs, attach them to the floor with carpet tape.  Make sure that you have a light switch at the top of the stairs and the bottom of the stairs. If you do not have them, ask someone to add them for you. What else can I do to help prevent falls?  Wear shoes that:  Do not have high heels.  Have rubber bottoms.  Are comfortable and fit you well.  Are closed at the toe. Do not wear sandals.  If you use a stepladder:  Make sure that it is fully opened. Do not climb a closed stepladder.  Make sure that both sides of the stepladder are locked into place.  Ask someone to hold it for you, if possible.  Clearly mark and make sure that you can  see:  Any grab bars or handrails.  First and last steps.  Where the edge of each step is.  Use tools that help you move around (mobility aids) if they are needed. These include:  Canes.  Walkers.  Scooters.  Crutches.  Turn on the lights when you go into a dark area. Replace any light bulbs as soon as they burn out.  Set up your furniture so you have a clear path. Avoid moving your furniture around.  If any of your floors are uneven, fix them.  If there are any pets around you, be aware of where they are.  Review your medicines with your doctor. Some medicines can make you feel dizzy. This can increase your chance of falling. Ask your doctor what other things that you can do to help prevent falls. This information is not intended to replace advice given to you by your health care provider. Make sure you discuss any questions you have with your health care provider. Document Released: 11/25/2008 Document Revised: 07/07/2015 Document Reviewed: 03/05/2014 Elsevier Interactive Patient Education  2017 [employer].

## 2019-08-20 NOTE — Progress Notes (Signed)
Subjective:   Jessica Walters is a 86 y.o. female who presents for Medicare Annual (Subsequent) preventive examination at clinic [facility].      Objective:    Today's Vitals   08/20/19 1436  BP: 134/80  Pulse: 64  Temp: (!) 96.2 F (35.7 C)  SpO2: 95%  Weight: 146 lb 6.4 oz (66.4 kg)  Height: 5\' 5"  (1.651 m)   Body mass index is 24.36 kg/m.  Advanced Directives 07/24/2018 04/21/2018 01/13/2018 01/10/2017 08/23/2016 11/17/2015  Does Patient Have a Medical Advance Directive? Yes No Yes Yes Yes Yes  Type of Product manager Power of [employer] Power of [employer] Power of [place];Living will  Does patient want to make changes to medical advance directive? No - Patient declined - No - Patient declined No - Patient declined No - Patient declined No - Patient declined  Copy of [place] in Chart? Yes - validated most recent copy scanned in chart (See row information) - Yes - validated most recent copy scanned in chart (See row information) Yes - Yes  Would patient like information on creating a medical advance directive? - No - Patient declined - - - -    Current Medications (verified) Outpatient Encounter Medications as of 08/20/2019  Medication Sig  . aspirin EC 81 MG tablet Take 81 mg by mouth daily.  Marland Kitchen atenolol (TENORMIN) 50 MG tablet Take 1 tablet (50 mg total) by mouth daily.  Marland Kitchen atorvastatin (LIPITOR) 20 MG tablet Take 1 tablet (20 mg total) by mouth daily.  . Calcium Carbonate-Vitamin D3 600-400 MG-UNIT TABS Take 600 mg by mouth 2 (two) times daily with a meal.  . Emollient (CERAVE) CREA Apply topically. Daily as needed  . enalapril (VASOTEC) 20 MG tablet Take 1 tablet (20 mg total) by mouth 2 (two) times daily.  . hydrochlorothiazide (HYDRODIURIL) 25 MG tablet Take 1 tablet (25 mg total) by mouth daily.  . hydrocortisone cream 1 % Apply 1 application  topically. Daily as needed  . metFORMIN (GLUCOPHAGE) 500 MG tablet Take 1 tablet (500 mg total) by mouth 2 (two) times daily with a meal.  . triamcinolone cream (KENALOG) 0.1 % APPLY TO AFFECTED AREA TWICE A DAY.   No facility-administered encounter medications on file as of 08/20/2019.    Allergies (verified) Shrimp [shellfish allergy] and Poison ivy extract   History: Past Medical History:  Diagnosis Date  . Coronary arteriosclerosis   . Diabetes mellitus without complication (HCC)    Type 2   . Hyperlipidemia   . Hypertension   . Myocardial infarction [facility])    Past Surgical History:  Procedure Laterality Date  . APPENDECTOMY  1988   Dr. Collier Salina  . CORONARY ARTERY BYPASS GRAFT  1990  . EYE SURGERY  2012   Dr, Vickki Muff  . KYPHOPLASTY  2015   Dr. Donivan Scull   Family History  Problem Relation Age of Onset  . Hypertension Mother   . Lung cancer Father    Social History   Socioeconomic History  . Marital status: Widowed    Spouse name: Not on file  . Number of children: Not on file  . Years of education: Not on file  . Highest education level: Not on file  Occupational History  . Not on file  Tobacco Use  . Smoking status: Never Smoker  . Smokeless tobacco: Never Used  Substance and Sexual Activity  . Alcohol use: Yes  Comment: 2-3 weekly  . Drug use: Not on file  . Sexual activity: Not on file  Other Topics Concern  . Not on file  Social History Narrative  . Not on file   Social Determinants of Health   Financial Resource Strain:   . Difficulty of Paying Living Expenses:   Food Insecurity:   . Worried About Charity fundraiser in the Last Year:   . Arboriculturist in the Last Year:   Transportation Needs:   . Film/video editor (Medical):   Marland Kitchen Lack of Transportation (Non-Medical):   Physical Activity:   . Days of Exercise per Week:   . Minutes of Exercise per Session:   Stress:   . Feeling of Stress :   Social Connections:   . Frequency of  Communication with Friends and Family:   . Frequency of Social Gatherings with Friends and Family:   . Attends Religious Services:   . Active Member of Clubs or Organizations:   . Attends Archivist Meetings:   Marland Kitchen Marital Status:     Tobacco Counseling Counseling given: Not Answered   Clinical Intake:     Pain : No/denies pain     Nutritional Status: BMI of 19-24  Normal Nutritional Risks: None Diabetes: Yes CBG done?: No Did pt. bring in CBG monitor from home?: Yes Glucose Meter Downloaded?: No  How often do you need to have someone help you when you read instructions, pamphlets, or other written materials from your doctor or pharmacy?: 1 - Never What is the last grade level you completed in school?: college 1 year  Diabetic?yes  Interpreter Needed?: No  Information entered by :: Traill NP   Activities of Daily Living In your present state of health, do you have any difficulty performing the following activities: 08/20/2019  Hearing? N  Vision? N  Difficulty concentrating or making decisions? N  Walking or climbing stairs? N  Dressing or bathing? N  Doing errands, shopping? N  Preparing Food and eating ? N  Using the Toilet? N  In the past six months, have you accidently leaked urine? Y  Comment uses pads, pessary  Do you have problems with loss of bowel control? N  Managing your Medications? N  Managing your Finances? N  Housekeeping or managing your Housekeeping? N  Some recent data might be hidden    Patient Care Team: Chidera Thivierge X, NP as PCP - General (Internal Medicine) Zamere Pasternak X, NP as Nurse Practitioner (Internal Medicine)  Indicate any recent Medical Services you may have received from other than [facility] providers in the past year (date may be approximate).     Assessment:   This is a routine wellness examination for Jessica Walters.  Hearing/Vision screen No exam data present  Dietary issues and exercise activities discussed: Current  Exercise Habits: Home exercise routine, Type of exercise: strength training/weights;stretching;walking, Time (Minutes): 30, Frequency (Times/Week): 6, Weekly Exercise (Minutes/Week): 180, Intensity: Mild, Exercise limited by: cardiac condition(s);orthopedic condition(s)  Goals    . Maintain Lifestyle     Starting today pt will maintain lifestyle.     . Patient Stated     As healthy as it is      Depression Screen PHQ 2/9 Scores 08/20/2019 01/13/2018 01/10/2017  PHQ - 2 Score 0 0 0  PHQ- 9 Score 2 - -    Fall Risk Fall Risk  08/20/2019 07/10/2019 04/09/2019 07/24/2018 05/30/2018  Falls in the past year? 0 0 0 -  0  Number falls in past yr: 0 0 0 0 0  Injury with Fall? - - - 0 0    Any stairs in or around the home? Yes  If so, are there any without handrails? no Home free of loose throw rugs in walkways, pet beds, electrical cords, etc? Yes  Adequate lighting in your home to reduce risk of falls? Yes   ASSISTIVE DEVICES UTILIZED TO PREVENT FALLS:  Life alert? no Use of a cane, walker or w/c? no Grab bars in the bathroom? yes Shower chair or bench in shower? no Elevated toilet seat or a handicapped toilet? no  TIMED UP AND GO: NA  Was the test performed? no Length of time to ambulate 10 feet: no  Gait steady and fast without use of assistive device normal gait.   Cognitive Function: MMSE - Mini Mental State Exam 08/20/2019 01/10/2017  Orientation to time 5 5  Orientation to Place 5 5  Registration 3 3  Attention/ Calculation 5 5  Recall 3 2  Language- name 2 objects 2 2  Language- repeat 1 1  Language- follow 3 step command 3 3  Language- read & follow direction 1 1  Write a sentence 1 1  Copy design 1 1  Total score 30 29        Immunizations Immunization History  Administered Date(s) Administered  . Influenza, High Dose Seasonal PF 11/27/2016, 11/14/2017  . Influenza, Seasonal, Injecte, Preservative Fre 12/27/2010, 12/21/2011  . Influenza,inj,Quad PF,6+ Mos  01/14/2013, 12/01/2013, 01/25/2015  . Influenza-Unspecified 11/12/2017  . Moderna SARS-COVID-2 Vaccination 06/08/2019, 07/06/2019  . Pneumococcal Conjugate-13 07/27/2014  . Pneumococcal Polysaccharide-23 11/07/2004    TDAP status: Up to date declined.   Qualifies for Shingles Vaccine? declined Zostavax completed declined.   Screening Tests Health Maintenance  Topic Date Due  . FOOT EXAM  Never done  . OPHTHALMOLOGY EXAM  Never done  . TETANUS/TDAP  Never done  . HEMOGLOBIN A1C  01/07/2020  . DEXA SCAN  Completed  . COVID-19 Vaccine  Completed  . PNA vac Low Risk Adult  Completed  . INFLUENZA VACCINE  Discontinued    Health Maintenance  Health Maintenance Due  Topic Date Due  . FOOT EXAM  Never done  . OPHTHALMOLOGY EXAM  Never done  . TETANUS/TDAP  Never done    Colorectal cancer screening: No longer required.  aged out Mammogram status: No longer required.   Aged out DEXA  declined.   Lung Cancer Screening: (Low Dose CT Chest recommended if Age 51-80 years, 30 pack-year currently smoking OR have quit w/in 15years.) does not qualify.   Lung Cancer Screening Referral: no  Additional Screening: none  Hepatitis C Screening: does not  qualify; Completed   Vision Screening: Recommended annual ophthalmology exams for early detection of glaucoma and other disorders of the eye. Is the patient up to date with their annual eye exam?  declined.  Who is the provider or what is the name of the office in which the patient attends annual eye exams? Declined.  If pt is not established with a provider, would they like to be referred to a provider to establish care? no  Dental Screening: Recommended annual dental exams for proper oral hygiene  Community Resource Referral / Chronic Care Management: CRR required this visit?  no  CCM required this visit?  no     Plan:     I have personally reviewed and noted the following in the patient's chart:   .  Medical and social  history . Use of alcohol, tobacco or illicit drugs  . Current medications and supplements . Functional ability and status . Nutritional status . Physical activity . Advanced directives . List of other physicians . Hospitalizations, surgeries, and ER visits in previous 12 months . Vitals . Screenings to include cognitive, depression, and falls . Referrals and appointments  In addition, I have reviewed and discussed with patient certain preventive protocols, quality metrics, and best practice recommendations. A written personalized care plan for preventive services as well as general preventive health recommendations were provided to patient.     Jessica Walters X Jessica Mcgonagle, NP   08/20/2019

## 2019-10-22 ENCOUNTER — Encounter: Payer: Self-pay | Admitting: Nurse Practitioner

## 2019-10-22 ENCOUNTER — Other Ambulatory Visit: Payer: Self-pay

## 2019-10-22 ENCOUNTER — Non-Acute Institutional Stay: Payer: PPO | Admitting: Nurse Practitioner

## 2019-10-22 DIAGNOSIS — E1100 Type 2 diabetes mellitus with hyperosmolarity without nonketotic hyperglycemic-hyperosmolar coma (NKHHC): Secondary | ICD-10-CM | POA: Diagnosis not present

## 2019-10-22 DIAGNOSIS — I10 Essential (primary) hypertension: Secondary | ICD-10-CM

## 2019-10-22 DIAGNOSIS — I252 Old myocardial infarction: Secondary | ICD-10-CM

## 2019-10-22 DIAGNOSIS — E782 Mixed hyperlipidemia: Secondary | ICD-10-CM

## 2019-10-22 NOTE — Assessment & Plan Note (Signed)
Hyperlipidemia, on Atrovastatin 20mg  qd, LDL 53 07/07/19

## 2019-10-22 NOTE — Assessment & Plan Note (Signed)
blood pressure is controlled, takes Atenolol 50mg  qd, Enalapril 20mg  qd,  HCTZ 25mg  qd.

## 2019-10-22 NOTE — Progress Notes (Signed)
Location:   clinic [place]   Place of Service:  Clinic (12) Provider: Marlana Latus NP  Code Status: DNR Goals of Care: [place] 07/24/2018  Does Patient Have a Medical Advance Directive? Yes  Type of Advance Directive [place]  Does patient want to make changes to medical advance directive? No - Patient declined  Copy of [place] in Chart? Yes - validated most recent copy scanned in chart (See row information)  Would patient like information on creating a medical advance directive? -     Chief Complaint  Patient presents with   Medical Management of Chronic Issues    Patient returns to the clinic for her follow up.    Health Maintenance    Foot and eye exam, TDAP    HPI: Patient is a 86 y.o. female seen today for medical management of chronic diseases.      HTN, blood pressure is controlled, takes Atenolol 50mg  qd, Enalapril 20mg  qd,  HCTZ 25mg  qd.   CAD, Hx of heart attack, takes Lipitor, Enalapril.   Hyperlipidemia, on Atrovastatin 20mg  qd, LDL 53 07/07/19  T2DM, stable, on Metformin 500mg  bid, Hgb a1c 6.7 07/07/19,  desires no routine ophthalmology, podiatrist visit.   Past Medical History:  Diagnosis Date   Coronary arteriosclerosis    Diabetes mellitus without complication ([place])    Type 2    Hyperlipidemia    Hypertension    Myocardial infarction [facility])     Past Surgical History:  Procedure Laterality Date   APPENDECTOMY  1988   Dr. Collier Salina   CORONARY ARTERY BYPASS GRAFT  1990   EYE SURGERY  2012   Dr, Vickki Muff   KYPHOPLASTY  2015   Dr. Donivan Scull    Allergies  Allergen Reactions   Shrimp [facility] Allergy]    Poison Ivy Extract Rash    Allergies as of 10/22/2019      Reactions   Shrimp [shellfish Allergy]    Poison Ivy Extract Rash      Medication List       Accurate as of October 22, 2019  2:09 PM. If you have any questions, ask your nurse or doctor.        aspirin EC 81 MG  tablet Take 81 mg by mouth daily.   atenolol 50 MG tablet Commonly known as: TENORMIN Take 1 tablet (50 mg total) by mouth daily.   atorvastatin 20 MG tablet Commonly known as: LIPITOR Take 1 tablet (20 mg total) by mouth daily.   Calcium Carbonate-Vitamin D3 600-400 MG-UNIT Tabs Take 600 mg by mouth 2 (two) times daily with a meal.   CeraVe Crea Apply topically. Daily as needed   enalapril 20 MG tablet Commonly known as: VASOTEC Take 1 tablet (20 mg total) by mouth 2 (two) times daily.   hydrochlorothiazide 25 MG tablet Commonly known as: HYDRODIURIL Take 1 tablet (25 mg total) by mouth daily.   hydrocortisone cream 1 % Apply 1 application topically. Daily as needed   metFORMIN 500 MG tablet Commonly known as: GLUCOPHAGE Take 1 tablet (500 mg total) by mouth 2 (two) times daily with a meal.   triamcinolone cream 0.1 % Commonly known as: KENALOG APPLY TO AFFECTED AREA TWICE A DAY.       Review of Systems:  Review of Systems  Constitutional: Negative for fatigue, fever and unexpected weight change.  HENT: Positive for hearing loss. Negative for congestion and voice change.   Respiratory: Negative for cough, shortness  of breath and wheezing.   Cardiovascular: Positive for leg swelling. Negative for chest pain and palpitations.  Gastrointestinal: Negative for abdominal pain, constipation and diarrhea.  Genitourinary: Negative for difficulty urinating, dysuria and urgency.       Pessary, nocturnal urination x1 average  Musculoskeletal: Positive for arthralgias and gait problem.  Skin: Negative for color change and pallor.  Neurological: Negative for speech difficulty, weakness, light-headedness and headaches.  Psychiatric/Behavioral: Negative for behavioral problems and self-injury. The patient is not nervous/anxious.        Sometimes stay awake after bathroom, but taking a nap during day.     Health Maintenance  Topic Date Due   FOOT EXAM  Never done    OPHTHALMOLOGY EXAM  Never done   TETANUS/TDAP  Never done   HEMOGLOBIN A1C  01/07/2020   DEXA SCAN  Completed   COVID-19 Vaccine  Completed   PNA vac Low Risk Adult  Completed   INFLUENZA VACCINE  Discontinued    Physical Exam: Vitals:   10/22/19 1254  BP: 112/76  Pulse: 62  Temp: (!) 96.8 F (36 C)  SpO2: 97%  Weight: 151 lb (68.5 kg)  Height: 5\' 5"  (1.651 m)   Body mass index is 25.13 kg/m. Physical Exam Vitals and nursing note reviewed.  Constitutional:      Appearance: Normal appearance.  HENT:     Head: Normocephalic and atraumatic.     Mouth/Throat:     Mouth: Mucous membranes are moist.  Eyes:     Extraocular Movements: Extraocular movements intact.     Conjunctiva/sclera: Conjunctivae normal.     Pupils: Pupils are equal, round, and reactive to light.  Cardiovascular:     Rate and Rhythm: Normal rate and regular rhythm.     Heart sounds: No murmur heard.   Pulmonary:     Breath sounds: No rhonchi or rales.  Abdominal:     General: Bowel sounds are normal.     Palpations: Abdomen is soft.     Tenderness: There is no abdominal tenderness.  Musculoskeletal:     Cervical back: Normal range of motion and neck supple.     Right lower leg: Edema present.     Left lower leg: Edema present.     Comments: Trace edema in ankles, hardly noticeable.   Skin:    General: Skin is warm and dry.  Neurological:     General: No focal deficit present.     Mental Status: She is alert and oriented to person, place, and time. Mental status is at baseline.  Psychiatric:        Mood and Affect: Mood normal.        Behavior: Behavior normal.        Thought Content: Thought content normal.        Judgment: Judgment normal.     Labs reviewed: Basic Metabolic Panel: Recent Labs    07/07/19 0700  NA 139  K 4.1  CL 99  CO2 33*  GLUCOSE 145*  BUN 24  CREATININE 0.95*  CALCIUM 9.8   Liver Function Tests: Recent Labs    07/07/19 0700  AST 12  ALT 9   BILITOT 0.9  PROT 6.1   No results for input(s): LIPASE, AMYLASE in the last 8760 hours. No results for input(s): AMMONIA in the last 8760 hours. CBC: Recent Labs    07/07/19 0700  WBC 8.9  NEUTROABS 6,631  HGB 14.4  HCT 44.0  MCV 93.0  PLT 177  Lipid Panel: Recent Labs    07/07/19 0700  CHOL 126  HDL 53  LDLCALC 53  TRIG 117  CHOLHDL 2.4   Lab Results  Component Value Date   HGBA1C 6.7 (H) 07/07/2019    Procedures since last visit: No results found.  Assessment/Plan  HTN (hypertension)  blood pressure is controlled, takes Atenolol 50mg  qd, Enalapril 20mg  qd,  HCTZ 25mg  qd.   History of heart attack CAD, Hx of heart attack, takes Lipitor, Enalapril.  Hyperlipidemia Hyperlipidemia, on Atrovastatin 20mg  qd, LDL 53 07/07/19  Type 2 diabetes mellitus (HCC) T2DM, stable, on Metformin 500mg  bid, Hgb a1c 6.7 07/07/19,  desires no routine ophthalmology, podiatrist visit.     Labs/tests ordered: none  Next appt:  3-4 months with Dr. Lyndel Safe

## 2019-10-22 NOTE — Assessment & Plan Note (Signed)
T2DM, stable, on Metformin 500mg  bid, Hgb a1c 6.7 07/07/19,  desires no routine ophthalmology, podiatrist visit.

## 2019-10-22 NOTE — Assessment & Plan Note (Signed)
CAD, Hx of heart attack, takes Lipitor, Enalapril.

## 2020-01-29 ENCOUNTER — Non-Acute Institutional Stay: Payer: PPO | Admitting: Internal Medicine

## 2020-01-29 ENCOUNTER — Encounter: Payer: Self-pay | Admitting: Internal Medicine

## 2020-01-29 ENCOUNTER — Other Ambulatory Visit: Payer: Self-pay

## 2020-01-29 VITALS — BP 162/86 | HR 66 | Temp 95.8°F | Ht 65.0 in | Wt 147.8 lb

## 2020-01-29 DIAGNOSIS — L239 Allergic contact dermatitis, unspecified cause: Secondary | ICD-10-CM

## 2020-01-29 DIAGNOSIS — I2510 Atherosclerotic heart disease of native coronary artery without angina pectoris: Secondary | ICD-10-CM

## 2020-01-29 DIAGNOSIS — I10 Essential (primary) hypertension: Secondary | ICD-10-CM

## 2020-01-29 DIAGNOSIS — E782 Mixed hyperlipidemia: Secondary | ICD-10-CM | POA: Diagnosis not present

## 2020-01-29 DIAGNOSIS — E1100 Type 2 diabetes mellitus with hyperosmolarity without nonketotic hyperglycemic-hyperosmolar coma (NKHHC): Secondary | ICD-10-CM

## 2020-01-30 NOTE — Progress Notes (Signed)
Location:  [place] of Service:  Clinic (12)  Provider:   Code Status:  Goals of Care:  Advanced Directives 07/24/2018  Does Patient Have a Medical Advance Directive? Yes  Type of Advance Directive [place]  Does patient want to make changes to medical advance directive? No - Patient declined  Copy of [place] in Chart? Yes - validated most recent copy scanned in chart (See row information)  Would patient like information on creating a medical advance directive? -     Chief Complaint  Patient presents with   Medical Management of Chronic Issues    Patient returns to the clinic for follow up. She has no concerns.     HPI: Patient is a 86 y.o. female seen today for medical management of chronic diseases.    Patient has h/o Hypertension, Hyperlipidemia, Type 2 Diabetes, Allergic Dermatitis 'BP runs high but does not want any changes right now No Acute Complain Lives by herself in [place] Son in town Does not drive But Independent in her ADLS and IADLs NO Falls No walker  Past Medical History:  Diagnosis Date   Coronary arteriosclerosis    Diabetes mellitus without complication (HCC)    Type 2    Hyperlipidemia    Hypertension    Myocardial infarction [facility])     Past Surgical History:  Procedure Laterality Date   APPENDECTOMY  1988   Dr. Collier Salina   CORONARY ARTERY BYPASS GRAFT  1990   EYE SURGERY  2012   Dr, Vickki Muff   KYPHOPLASTY  2015   Dr. Donivan Scull    Allergies  Allergen Reactions   Shrimp [Shellfish Allergy]    Poison Ivy Extract Rash    Outpatient Encounter Medications as of 01/29/2020  Medication Sig   aspirin EC 81 MG tablet Take 81 mg by mouth daily.   atenolol (TENORMIN) 50 MG tablet Take 1 tablet (50 mg total) by mouth daily.   atorvastatin (LIPITOR) 20 MG tablet Take 1 tablet (20 mg total) by mouth daily.   Calcium Carbonate-Vitamin D3 600-400 MG-UNIT TABS Take 600 mg  by mouth 2 (two) times daily with a meal.   Emollient (CERAVE) CREA Apply topically. Daily as needed   enalapril (VASOTEC) 20 MG tablet Take 1 tablet (20 mg total) by mouth 2 (two) times daily.   hydrochlorothiazide (HYDRODIURIL) 25 MG tablet Take 1 tablet (25 mg total) by mouth daily.   hydrocortisone cream 1 % Apply 1 application topically. Daily as needed   metFORMIN (GLUCOPHAGE) 500 MG tablet Take 1 tablet (500 mg total) by mouth 2 (two) times daily with a meal.   triamcinolone cream (KENALOG) 0.1 % APPLY TO AFFECTED AREA TWICE A DAY.   No facility-administered encounter medications on file as of 01/29/2020.    Review of Systems:  Review of Systems  Review of Systems  Constitutional: Negative for activity change, appetite change, chills, diaphoresis, fatigue and fever.  HENT: Negative for mouth sores, postnasal drip, rhinorrhea, sinus pain and sore throat.   Respiratory: Negative for apnea, cough, chest tightness, shortness of breath and wheezing.   Cardiovascular: Negative for chest pain, palpitations and leg swelling.  Gastrointestinal: Negative for abdominal distention, abdominal pain, constipation, diarrhea, nausea and vomiting.  Genitourinary: Negative for dysuria and frequency.  Musculoskeletal: Negative for arthralgias, joint swelling and myalgias.  Skin: Negative for rash.  Neurological: Negative for dizziness, syncope, weakness, light-headedness and numbness.  Psychiatric/Behavioral: Negative for behavioral problems, confusion and sleep  disturbance.     Health Maintenance  Topic Date Due   FOOT EXAM  Never done   OPHTHALMOLOGY EXAM  Never done   TETANUS/TDAP  Never done   HEMOGLOBIN A1C  01/07/2020   DEXA SCAN  Completed   COVID-19 Vaccine  Completed   PNA vac Low Risk Adult  Completed   INFLUENZA VACCINE  Discontinued    Physical Exam: Vitals:   01/29/20 1001  BP: (!) 162/86  Pulse: 66  Temp: (!) 95.8 F (35.4 C)  SpO2: 98%  Weight: 147 lb  12.8 oz (67 kg)  Height: 5\' 5"  (1.651 m)   Body mass index is 24.6 kg/m. Physical Exam  Constitutional: Oriented to person, place, and time. Well-developed and well-nourished.  HENT:  Head: Normocephalic.  Mouth/Throat: Oropharynx is clear and moist.  Eyes: Pupils are equal, round, and reactive to light.  Neck: Neck supple.  Cardiovascular: Normal rate and normal heart sounds.  No murmur heard. Pulmonary/Chest: Effort normal and breath sounds normal. No respiratory distress. No wheezes. She has no rales.  Abdominal: Soft. Bowel sounds are normal. No distension. There is no tenderness. There is no rebound.  Musculoskeletal: No edema. Foot Exam No Open Lesion Good sensory and Pulses are normal Lymphadenopathy: none Neurological: Alert and oriented to person, place, and time.  Skin: Skin is warm and dry.  Psychiatric: Normal mood and affect. Behavior is normal. Thought content normal.    Labs reviewed: Basic Metabolic Panel: Recent Labs    07/07/19 0700  NA 139  K 4.1  CL 99  CO2 33*  GLUCOSE 145*  BUN 24  CREATININE 0.95*  CALCIUM 9.8   Liver Function Tests: Recent Labs    07/07/19 0700  AST 12  ALT 9  BILITOT 0.9  PROT 6.1   No results for input(s): LIPASE, AMYLASE in the last 8760 hours. No results for input(s): AMMONIA in the last 8760 hours. CBC: Recent Labs    07/07/19 0700  WBC 8.9  NEUTROABS 6,631  HGB 14.4  HCT 44.0  MCV 93.0  PLT 177   Lipid Panel: Recent Labs    07/07/19 0700  CHOL 126  HDL 53  LDLCALC 53  TRIG 117  CHOLHDL 2.4   Lab Results  Component Value Date   HGBA1C 6.7 (H) 07/07/2019    Procedures since last visit: No results found.  Assessment/Plan 1. Essential hypertension Does not want anymore meds to control the BP Or change the existence doses - CBC with Differential/Platelet; Future  2. Type 2 diabetes mellitus with hyperosmolarity without coma, without long-term current use of insulin (HCC) Doing well on  Metformin - COMPLETE METABOLIC PANEL WITH GFR; Future - Hemoglobin A1c; Future - TSH; Future  3. Mixed hyperlipidemia Continue statin  - Lipid panel; Future  4. Coronary artery disease involving native coronary artery of native heart without angina pectoris Statin,Beta Blocker and asprin  5. Allergic dermatitis Triamcinolone cream PRN    Labs/tests ordered:  * No order type specified * Next appt:  05/19/2020

## 2020-03-08 ENCOUNTER — Encounter: Payer: PPO | Admitting: Nurse Practitioner

## 2020-05-19 ENCOUNTER — Other Ambulatory Visit: Payer: Self-pay

## 2020-05-19 DIAGNOSIS — E1100 Type 2 diabetes mellitus with hyperosmolarity without nonketotic hyperglycemic-hyperosmolar coma (NKHHC): Secondary | ICD-10-CM

## 2020-05-19 DIAGNOSIS — E782 Mixed hyperlipidemia: Secondary | ICD-10-CM | POA: Diagnosis not present

## 2020-05-19 DIAGNOSIS — I10 Essential (primary) hypertension: Secondary | ICD-10-CM | POA: Diagnosis not present

## 2020-05-20 LAB — CBC WITH DIFFERENTIAL/PLATELET
Absolute Monocytes: 511 cells/uL (ref 200–950)
Basophils Absolute: 41 cells/uL (ref 0–200)
Basophils Relative: 0.6 %
Eosinophils Absolute: 159 cells/uL (ref 15–500)
Eosinophils Relative: 2.3 %
HCT: 46.5 % — ABNORMAL HIGH (ref 35.0–45.0)
Hemoglobin: 15.3 g/dL (ref 11.7–15.5)
Lymphs Abs: 1808 cells/uL (ref 850–3900)
MCH: 30.7 pg (ref 27.0–33.0)
MCHC: 32.9 g/dL (ref 32.0–36.0)
MCV: 93.4 fL (ref 80.0–100.0)
MPV: 10.5 fL (ref 7.5–12.5)
Monocytes Relative: 7.4 %
Neutro Abs: 4382 cells/uL (ref 1500–7800)
Neutrophils Relative %: 63.5 %
Platelets: 187 10*3/uL (ref 140–400)
RBC: 4.98 10*6/uL (ref 3.80–5.10)
RDW: 12.1 % (ref 11.0–15.0)
Total Lymphocyte: 26.2 %
WBC: 6.9 10*3/uL (ref 3.8–10.8)

## 2020-05-20 LAB — COMPLETE METABOLIC PANEL WITH GFR
AG Ratio: 1.9 (calc) (ref 1.0–2.5)
ALT: 12 U/L (ref 6–29)
AST: 14 U/L (ref 10–35)
Albumin: 4.1 g/dL (ref 3.6–5.1)
Alkaline phosphatase (APISO): 63 U/L (ref 37–153)
BUN/Creatinine Ratio: 31 (calc) — ABNORMAL HIGH (ref 6–22)
BUN: 26 mg/dL — ABNORMAL HIGH (ref 7–25)
CO2: 28 mmol/L (ref 20–32)
Calcium: 9.7 mg/dL (ref 8.6–10.4)
Chloride: 99 mmol/L (ref 98–110)
Creat: 0.84 mg/dL (ref 0.60–0.88)
GFR, Est African American: 69 mL/min/{1.73_m2} (ref >=60)
GFR, Est Non African American: 59 mL/min/{1.73_m2} — ABNORMAL LOW (ref >=60)
Globulin: 2.2 g/dL (calc) (ref 1.9–3.7)
Glucose, Bld: 117 mg/dL — ABNORMAL HIGH (ref 65–99)
Potassium: 3.6 mmol/L (ref 3.5–5.3)
Sodium: 139 mmol/L (ref 135–146)
Total Bilirubin: 0.8 mg/dL (ref 0.2–1.2)
Total Protein: 6.3 g/dL (ref 6.1–8.1)

## 2020-05-20 LAB — LIPID PANEL
Cholesterol: 128 mg/dL (ref <200)
HDL: 56 mg/dL (ref >=50)
LDL Cholesterol (Calc): 53 mg/dL (calc)
Non-HDL Cholesterol (Calc): 72 mg/dL (calc) (ref <130)
Total CHOL/HDL Ratio: 2.3 (calc) (ref <5.0)
Triglycerides: 107 mg/dL (ref <150)

## 2020-05-20 LAB — HEMOGLOBIN A1C
Hgb A1c MFr Bld: 6.8 % of total Hgb — ABNORMAL HIGH (ref <5.7)
Mean Plasma Glucose: 148 mg/dL
eAG (mmol/L): 8.2 mmol/L

## 2020-05-20 LAB — TSH: TSH: 3.12 mIU/L (ref 0.40–4.50)

## 2020-05-26 ENCOUNTER — Other Ambulatory Visit: Payer: Self-pay

## 2020-05-26 ENCOUNTER — Encounter: Payer: Self-pay | Admitting: Nurse Practitioner

## 2020-05-26 ENCOUNTER — Non-Acute Institutional Stay: Payer: PPO | Admitting: Nurse Practitioner

## 2020-05-26 DIAGNOSIS — E782 Mixed hyperlipidemia: Secondary | ICD-10-CM

## 2020-05-26 DIAGNOSIS — I252 Old myocardial infarction: Secondary | ICD-10-CM

## 2020-05-26 DIAGNOSIS — E1100 Type 2 diabetes mellitus with hyperosmolarity without nonketotic hyperglycemic-hyperosmolar coma (NKHHC): Secondary | ICD-10-CM | POA: Diagnosis not present

## 2020-05-26 DIAGNOSIS — E785 Hyperlipidemia, unspecified: Secondary | ICD-10-CM | POA: Diagnosis not present

## 2020-05-26 DIAGNOSIS — I10 Essential (primary) hypertension: Secondary | ICD-10-CM

## 2020-05-26 DIAGNOSIS — L239 Allergic contact dermatitis, unspecified cause: Secondary | ICD-10-CM | POA: Diagnosis not present

## 2020-05-26 NOTE — Assessment & Plan Note (Signed)
on Atrovastatin 20mg  qd, LDL 53 05/19/20

## 2020-05-26 NOTE — Assessment & Plan Note (Signed)
stable, on Metformin 500mg  bid  desires no routine ophthalmology, podiatrist visit. Hgb a1c 6.8, TSH 3.12 05/19/20

## 2020-05-26 NOTE — Assessment & Plan Note (Signed)
Intermittent macular spots on thighs, scattered, a few, itching at times,  chronic, Triamcinolone topical helps

## 2020-05-26 NOTE — Progress Notes (Signed)
Location:   clinic [place]   Place of Service:   clinic [place] Provider: Marlana Latus NP  Code Status: DNR Goals of Care: [place] 05/26/2020  Does Patient Have a Medical Advance Directive? Yes  Type of Advance Directive [place]  Does patient want to make changes to medical advance directive? No - Patient declined  Copy of [place] in Chart? Yes - validated most recent copy scanned in chart (See row information)  Would patient like information on creating a medical advance directive? -     Chief Complaint  Patient presents with  . Medical Management of Chronic Issues    4 month follow up.    HPI: Patient is a 87 y.o. female seen today for medical management of chronic diseases.    HTN, blood pressure is controlled, takes Atenolol 50mg  qd, Enalapril 20mg  bid,  HCTZ 25mg  qd. Bun/creat 26/0.84 05/19/20             CAD, Hx of heart attack, takes Lipitor, Enalapril.              Hyperlipidemia, on Atrovastatin 20mg  qd, LDL 53 05/19/20             T2DM, stable, on Metformin 500mg  bid  desires no routine ophthalmology, podiatrist visit. Hgb a1c 6.8, TSH 3.12 05/19/20    Past Medical History:  Diagnosis Date  . Coronary arteriosclerosis   . Diabetes mellitus without complication (HCC)    Type 2   . Hyperlipidemia   . Hypertension   . Myocardial infarction [facility])     Past Surgical History:  Procedure Laterality Date  . APPENDECTOMY  1988   Dr. Collier Salina  . CORONARY ARTERY BYPASS GRAFT  1990  . EYE SURGERY  2012   Dr, Vickki Muff  . KYPHOPLASTY  2015   Dr. Donivan Scull    Allergies  Allergen Reactions  . Shrimp [Shellfish Allergy]   . Poison Ivy Extract Rash    Allergies as of 05/26/2020      Reactions   Shrimp [shellfish Allergy]    Poison Ivy Extract Rash      Medication List       Accurate as of May 26, 2020 11:59 PM. If you have any questions, ask your nurse or doctor.        aspirin EC 81 MG tablet Take 81 mg by mouth  daily.   atenolol 50 MG tablet Commonly known as: TENORMIN Take 1 tablet (50 mg total) by mouth daily.   atorvastatin 20 MG tablet Commonly known as: LIPITOR Take 1 tablet (20 mg total) by mouth daily.   Calcium Carbonate-Vitamin D3 600-400 MG-UNIT Tabs Take 600 mg by mouth 2 (two) times daily with a meal.   CeraVe Crea Apply topically. Daily as needed   enalapril 20 MG tablet Commonly known as: VASOTEC Take 1 tablet (20 mg total) by mouth 2 (two) times daily.   hydrochlorothiazide 25 MG tablet Commonly known as: HYDRODIURIL Take 1 tablet (25 mg total) by mouth daily.   hydrocortisone cream 1 % Apply 1 application topically. Daily as needed   metFORMIN 500 MG tablet Commonly known as: GLUCOPHAGE Take 1 tablet (500 mg total) by mouth 2 (two) times daily with a meal.   triamcinolone cream 0.1 % Commonly known as: KENALOG APPLY TO AFFECTED AREA TWICE A DAY.       Review of Systems:  Review of Systems  Constitutional: Negative for fatigue, fever and unexpected weight change.  HENT: Positive for hearing loss. Negative for congestion and voice change.   Eyes: Negative for visual disturbance.  Respiratory: Positive for cough. Negative for shortness of breath and wheezing.        Occasional hacking cough for 30 more years since started ACEI  Cardiovascular: Positive for leg swelling. Negative for chest pain and palpitations.  Gastrointestinal: Negative for abdominal pain, constipation and diarrhea.  Genitourinary: Negative for difficulty urinating, dysuria and urgency.       Pessary, nocturnal urination x1 average  Musculoskeletal: Positive for arthralgias and gait problem.  Skin: Positive for rash. Negative for color change.  Neurological: Negative for speech difficulty, weakness and headaches.  Psychiatric/Behavioral: Negative for behavioral problems and sleep disturbance. The patient is not nervous/anxious.        Sometimes stay awake after bathroom, but taking a nap  during day.     Health Maintenance  Topic Date Due  . TETANUS/TDAP  01/29/2021 (Originally 07/08/1942)  . HEMOGLOBIN A1C  11/18/2020  . FOOT EXAM  01/28/2021  . DEXA SCAN  Completed  . COVID-19 Vaccine  Completed  . PNA vac Low Risk Adult  Completed  . HPV VACCINES  Aged Out  . INFLUENZA VACCINE  Discontinued  . OPHTHALMOLOGY EXAM  Discontinued    Physical Exam: Vitals:   05/26/20 1344  BP: 110/70  Pulse: 71  Resp: 16  Temp: (!) 96 F (35.6 C)  SpO2: 96%  Weight: 148 lb 6.4 oz (67.3 kg)  Height: 5\' 5"  (1.651 m)   Body mass index is 24.7 kg/m. Physical Exam Vitals and nursing note reviewed.  Constitutional:      Appearance: Normal appearance.  HENT:     Head: Normocephalic and atraumatic.     Mouth/Throat:     Mouth: Mucous membranes are moist.  Eyes:     Extraocular Movements: Extraocular movements intact.     Conjunctiva/sclera: Conjunctivae normal.     Pupils: Pupils are equal, round, and reactive to light.  Cardiovascular:     Rate and Rhythm: Normal rate and regular rhythm.     Heart sounds: No murmur heard.   Pulmonary:     Breath sounds: No rhonchi or rales.  Abdominal:     General: Bowel sounds are normal.     Palpations: Abdomen is soft.     Tenderness: There is no abdominal tenderness.  Musculoskeletal:     Cervical back: Normal range of motion and neck supple.     Right lower leg: Edema present.     Left lower leg: Edema present.     Comments: Trace edema in ankles, hardly noticeable.   Skin:    General: Skin is warm and dry.     Comments: Macular spots on thighs, scattered, a few, chronic, Triamcinolone helps  Neurological:     General: No focal deficit present.     Mental Status: She is alert and oriented to person, place, and time. Mental status is at baseline.  Psychiatric:        Mood and Affect: Mood normal.        Behavior: Behavior normal.        Thought Content: Thought content normal.        Judgment: Judgment normal.     Labs  reviewed: Basic Metabolic Panel: Recent Labs    07/07/19 0700 05/19/20 0715  NA 139 139  K 4.1 3.6  CL 99 99  CO2 33* 28  GLUCOSE 145* 117*  BUN 24 26*  CREATININE 0.95* 0.84  CALCIUM 9.8 9.7  TSH  --  3.12   Liver Function Tests: Recent Labs    07/07/19 0700 05/19/20 0715  AST 12 14  ALT 9 12  BILITOT 0.9 0.8  PROT 6.1 6.3   No results for input(s): LIPASE, AMYLASE in the last 8760 hours. No results for input(s): AMMONIA in the last 8760 hours. CBC: Recent Labs    07/07/19 0700 05/19/20 0715  WBC 8.9 6.9  NEUTROABS 6,631 4,382  HGB 14.4 15.3  HCT 44.0 46.5*  MCV 93.0 93.4  PLT 177 187   Lipid Panel: Recent Labs    07/07/19 0700 05/19/20 0715  CHOL 126 128  HDL 53 56  LDLCALC 53 53  TRIG 117 107  CHOLHDL 2.4 2.3   Lab Results  Component Value Date   HGBA1C 6.8 (H) 05/19/2020    Procedures since last visit: No results found.  Assessment/Plan  Type 2 diabetes mellitus (HCC) stable, on Metformin 500mg  bid  desires no routine ophthalmology, podiatrist visit. Hgb a1c 6.8, TSH 3.12 05/19/20    Hyperlipidemia on Atrovastatin 20mg  qd, LDL 53 05/19/20   History of heart attack Hx of heart attack, takes Lipitor, Enalapril.    HTN (hypertension) blood pressure is controlled, takes Atenolol 50mg  qd, Enalapril 20mg  bid,  HCTZ 25mg  qd. Bun/creat 26/0.84 05/19/20   Allergic dermatitis Intermittent macular spots on thighs, scattered, a few, itching at times,  chronic, Triamcinolone topical helps    Labs/tests ordered: none  Next appt:  6 months

## 2020-05-26 NOTE — Assessment & Plan Note (Addendum)
blood pressure is controlled, takes Atenolol 50mg  qd, Enalapril 20mg  bid,  HCTZ 25mg  qd. Bun/creat 26/0.84 05/19/20

## 2020-05-26 NOTE — Assessment & Plan Note (Signed)
Hx of heart attack, takes Lipitor, Enalapril.

## 2020-05-30 ENCOUNTER — Encounter: Payer: Self-pay | Admitting: Nurse Practitioner

## 2020-05-30 MED ORDER — HYDROCHLOROTHIAZIDE 25 MG PO TABS
25.0000 mg | ORAL_TABLET | Freq: Every day | ORAL | 2 refills | Status: DC
Start: 2020-05-30 — End: 2021-03-20

## 2020-05-30 MED ORDER — METFORMIN HCL 500 MG PO TABS: 500.0000 mg | ORAL_TABLET | Freq: Two times a day (BID) | ORAL | 2 refills | Status: DC

## 2020-05-30 MED ORDER — ATORVASTATIN CALCIUM 20 MG PO TABS: 20.0000 mg | ORAL_TABLET | Freq: Every day | ORAL | 2 refills | Status: DC

## 2020-05-30 MED ORDER — TRIAMCINOLONE ACETONIDE 0.1 % EX CREA: TOPICAL_CREAM | CUTANEOUS | 3 refills | Status: DC

## 2020-05-30 MED ORDER — ATENOLOL 50 MG PO TABS: 50.0000 mg | ORAL_TABLET | Freq: Every day | ORAL | 2 refills | Status: DC

## 2020-06-15 ENCOUNTER — Other Ambulatory Visit: Payer: Self-pay | Admitting: Internal Medicine

## 2020-06-15 DIAGNOSIS — I252 Old myocardial infarction: Secondary | ICD-10-CM

## 2020-09-11 ENCOUNTER — Emergency Department (HOSPITAL_COMMUNITY)
Admission: EM | Admit: 2020-09-11 | Discharge: 2020-09-11 | Disposition: A | Discharge status: Home / Self Care | Payer: PPO | Attending: Emergency Medicine | Admitting: Emergency Medicine

## 2020-09-11 ENCOUNTER — Emergency Department (HOSPITAL_COMMUNITY): Payer: PPO

## 2020-09-11 ENCOUNTER — Encounter (HOSPITAL_COMMUNITY): Payer: Self-pay

## 2020-09-11 ENCOUNTER — Other Ambulatory Visit: Payer: Self-pay

## 2020-09-11 DIAGNOSIS — R531 Weakness: Secondary | ICD-10-CM | POA: Diagnosis not present

## 2020-09-11 DIAGNOSIS — M546 Pain in thoracic spine: Secondary | ICD-10-CM | POA: Insufficient documentation

## 2020-09-11 DIAGNOSIS — I10 Essential (primary) hypertension: Secondary | ICD-10-CM | POA: Diagnosis not present

## 2020-09-11 DIAGNOSIS — Z7984 Long term (current) use of oral hypoglycemic drugs: Secondary | ICD-10-CM | POA: Diagnosis not present

## 2020-09-11 DIAGNOSIS — Z7982 Long term (current) use of aspirin: Secondary | ICD-10-CM | POA: Insufficient documentation

## 2020-09-11 DIAGNOSIS — Z955 Presence of coronary angioplasty implant and graft: Secondary | ICD-10-CM | POA: Diagnosis not present

## 2020-09-11 DIAGNOSIS — Z79899 Other long term (current) drug therapy: Secondary | ICD-10-CM | POA: Diagnosis not present

## 2020-09-11 DIAGNOSIS — M79601 Pain in right arm: Secondary | ICD-10-CM | POA: Insufficient documentation

## 2020-09-11 DIAGNOSIS — E119 Type 2 diabetes mellitus without complications: Secondary | ICD-10-CM | POA: Insufficient documentation

## 2020-09-11 LAB — CBC WITH DIFFERENTIAL/PLATELET
Abs Immature Granulocytes: 0.03 10*3/uL (ref 0.00–0.07)
Basophils Absolute: 0 10*3/uL (ref 0.0–0.1)
Basophils Relative: 1 %
Eosinophils Absolute: 0.1 10*3/uL (ref 0.0–0.5)
Eosinophils Relative: 2 %
HCT: 48.2 % — ABNORMAL HIGH (ref 36.0–46.0)
Hemoglobin: 16.1 g/dL — ABNORMAL HIGH (ref 12.0–15.0)
Immature Granulocytes: 0 %
Lymphocytes Relative: 17 %
Lymphs Abs: 1.5 10*3/uL (ref 0.7–4.0)
MCH: 30.5 pg (ref 26.0–34.0)
MCHC: 33.4 g/dL (ref 30.0–36.0)
MCV: 91.3 fL (ref 80.0–100.0)
Monocytes Absolute: 0.4 10*3/uL (ref 0.1–1.0)
Monocytes Relative: 5 %
Neutro Abs: 6.6 10*3/uL (ref 1.7–7.7)
Neutrophils Relative %: 75 %
Platelets: 189 10*3/uL (ref 150–400)
RBC: 5.28 MIL/uL — ABNORMAL HIGH (ref 3.87–5.11)
RDW: 12.7 % (ref 11.5–15.5)
WBC: 8.7 10*3/uL (ref 4.0–10.5)
nRBC: 0 % (ref 0.0–0.2)

## 2020-09-11 LAB — COMPREHENSIVE METABOLIC PANEL
ALT: 15 U/L (ref 0–44)
AST: 16 U/L (ref 15–41)
Albumin: 4.2 g/dL (ref 3.5–5.0)
Alkaline Phosphatase: 63 U/L (ref 38–126)
Anion gap: 10 (ref 5–15)
BUN: 22 mg/dL (ref 8–23)
CO2: 26 mmol/L (ref 22–32)
Calcium: 9.4 mg/dL (ref 8.9–10.3)
Chloride: 94 mmol/L — ABNORMAL LOW (ref 98–111)
Creatinine, Ser: 1.01 mg/dL — ABNORMAL HIGH (ref 0.44–1.00)
GFR, Estimated: 51 mL/min — ABNORMAL LOW (ref >60)
Glucose, Bld: 165 mg/dL — ABNORMAL HIGH (ref 70–99)
Potassium: 3.6 mmol/L (ref 3.5–5.1)
Sodium: 130 mmol/L — ABNORMAL LOW (ref 135–145)
Total Bilirubin: 0.9 mg/dL (ref 0.3–1.2)
Total Protein: 7 g/dL (ref 6.5–8.1)

## 2020-09-11 LAB — TROPONIN I (HIGH SENSITIVITY)
Troponin I (High Sensitivity): 6 ng/L (ref <18)
Troponin I (High Sensitivity): 7 ng/L (ref <18)

## 2020-09-11 LAB — CBG MONITORING, ED: Glucose-Capillary: 115 mg/dL — ABNORMAL HIGH (ref 70–99)

## 2020-09-11 NOTE — ED Provider Notes (Signed)
[facility] DEPT Provider Note   CSN: [id number] Arrival date & time: 09/11/20  0803     History Chief Complaint  Patient presents with   Hand Problem   Back Pain    Jessica Walters is a 87 y.o. female.  HPI Patient reports mid upper back pain, present for 1 week, without known trauma.  This made her worried about her possibility of having another heart attack which occurred with similar pain, 30 years ago.  She also has ongoing weakness and cramping in her right arm with history of decreased blood flow that she describes as being caused by vasculitis, about 15 years ago.  Her right hand feels like it is cramping.  She has previously had kyphoplasty of the upper back, area where her back hurts today.  She denies shortness of breath, nausea, vomiting, general weakness or dizziness.  She is taking her usual medications.  There are no other known active modifying factors.    Past Medical History:  Diagnosis Date   Coronary arteriosclerosis    Diabetes mellitus without complication ([place])    Type 2    Hyperlipidemia    Hypertension    Myocardial infarction [facility])     Patient Active Problem List   Diagnosis Date Noted   Allergic dermatitis 03/27/2018   Compression fracture of L2 ([place]) 02/23/2016   Hyperlipidemia 02/23/2016   Type 2 diabetes mellitus ([place]) 11/17/2015   HTN (hypertension) 11/17/2015   History of heart attack 11/17/2015   Cystocele, unspecified (CODE) 11/17/2015    Past Surgical History:  Procedure Laterality Date   APPENDECTOMY  1988   Dr. Collier Salina   CORONARY ARTERY BYPASS GRAFT  1990   EYE SURGERY  2012   Dr, Vickki Muff   KYPHOPLASTY  2015   Dr. Donivan Scull     OB History   No obstetric history on file.     Family History  Problem Relation Age of Onset   Hypertension Mother    Lung cancer Father     Social History   Tobacco Use   Smoking status: Never   Smokeless tobacco: Never  Substance Use Topics   Alcohol use:  Yes    Comment: 2-3 weekly    Home Medications Prior to Admission medications   Medication Sig Start Date End Date Taking? Authorizing Provider  aspirin EC 81 MG tablet Take 81 mg by mouth daily.   Yes [provider]  atenolol (TENORMIN) 50 MG tablet Take 1 tablet (50 mg total) by mouth daily. 05/30/20  Yes Mast, Man X, NP  atorvastatin (LIPITOR) 20 MG tablet Take 1 tablet (20 mg total) by mouth daily. 05/30/20  Yes Mast, Man X, NP  Calcium Carbonate-Vitamin D3 600-400 MG-UNIT TABS Take 600 mg by mouth 2 (two) times daily with a meal. 11/17/15  Yes Mast, Man X, NP  enalapril (VASOTEC) 20 MG tablet TAKE 1 TABLET BY MOUTH TWICE DAILY. Patient taking differently: Take 20 mg by mouth 2 (two) times daily. 06/15/20  Yes Mast, Man X, NP  hydrochlorothiazide (HYDRODIURIL) 25 MG tablet Take 1 tablet (25 mg total) by mouth daily. 05/30/20  Yes Mast, Man X, NP  ibuprofen (ADVIL) 200 MG tablet Take 200 mg by mouth 2 (two) times daily as needed for fever, headache or mild pain.   Yes [provider]  metFORMIN (GLUCOPHAGE) 500 MG tablet Take 1 tablet (500 mg total) by mouth 2 (two) times daily with a meal. 05/30/20  Yes Mast, Man X, NP  triamcinolone cream (KENALOG) 0.1 % APPLY TO AFFECTED AREA TWICE A DAY. Patient not taking: Reported on 09/11/2020 05/30/20   Mast, Man X, NP    Allergies    Shrimp [shellfish allergy] and Poison ivy extract  Review of Systems   Review of Systems  All other systems reviewed and are negative.  Physical Exam Updated Vital Signs BP (!) 182/75   Pulse 69   Temp 99.4 F (37.4 C) (Oral)   Resp 19   Ht '5\' 5"'$  (1.651 m)   Wt 65.8 kg   SpO2 98%   BMI 24.13 kg/m   Physical Exam Vitals and nursing note reviewed.  Constitutional:      General: She is not in acute distress.    Appearance: She is well-developed. She is not ill-appearing, toxic-appearing or diaphoretic.  HENT:     Head: Normocephalic and atraumatic.     Right Ear: External ear normal.      Left Ear: External ear normal.  Eyes:     Conjunctiva/sclera: Conjunctivae normal.     Pupils: Pupils are equal, round, and reactive to light.  Neck:     Trachea: Phonation normal.  Cardiovascular:     Rate and Rhythm: Normal rate and regular rhythm.     Heart sounds: Normal heart sounds.  Pulmonary:     Effort: Pulmonary effort is normal. No respiratory distress.     Breath sounds: Normal breath sounds. No stridor. No rhonchi.  Abdominal:     General: There is no distension.     Palpations: Abdomen is soft.     Tenderness: There is no abdominal tenderness.  Musculoskeletal:        General: Normal range of motion.     Cervical back: Normal range of motion and neck supple.  Skin:    General: Skin is warm and dry.  Neurological:     Mental Status: She is alert and oriented to person, place, and time.     Cranial Nerves: No cranial nerve deficit.     Sensory: No sensory deficit.     Motor: No abnormal muscle tone.     Coordination: Coordination normal.     Comments: Normal sensation arms legs bilaterally.  Fingers and toes are warm.  Psychiatric:        Mood and Affect: Mood normal.        Behavior: Behavior normal.        Thought Content: Thought content normal.        Judgment: Judgment normal.    ED Results / Procedures / Treatments   Labs (all labs ordered are listed, but only abnormal results are displayed) Labs Reviewed  COMPREHENSIVE METABOLIC PANEL - Abnormal; Notable for the following components:      Result Value   Sodium 130 (*)    Chloride 94 (*)    Glucose, Bld 165 (*)    Creatinine, Ser 1.01 (*)    GFR, Estimated 51 (*)    All other components within normal limits  CBC WITH DIFFERENTIAL/PLATELET - Abnormal; Notable for the following components:   RBC 5.28 (*)    Hemoglobin 16.1 (*)    HCT 48.2 (*)    All other components within normal limits  TROPONIN I (HIGH SENSITIVITY)  TROPONIN I (HIGH SENSITIVITY)    EKG EKG Interpretation  Date/Time:  Sunday  September 11 2020 09:06:46 EDT Ventricular Rate:  69 PR Interval:  61 QRS Duration: 116 QT Interval:  416 QTC Calculation: 446 R Axis:   -57  Text Interpretation: Sinus rhythm Short PR interval LVH with IVCD, LAD and secondary repol abnrm No old tracing to compare Confirmed by Daleen Bo [phone]) on 09/11/2020 9:28:30 AM  Radiology DG Chest 2 View  Result Date: 09/11/2020 CLINICAL DATA:  Pain.  Right arm weakness. EXAM: CHEST - 2 VIEW COMPARISON:  None. FINDINGS: The heart size and mediastinal contours are within normal limits. Aortic atherosclerosis. Both lungs are clear. The visualized skeletal structures are unremarkable. IMPRESSION: 1. No active cardiopulmonary disease. 2. Aortic atherosclerosis. Electronically Signed   By: Kerby Moors M.D.   On: 09/11/2020 09:22    Procedures Procedures   Medications Ordered in ED Medications - No data to display  ED Course  I have reviewed the triage vital signs and the nursing notes.  Pertinent labs & imaging results that were available during my care of the patient were reviewed by me and considered in my medical decision making (see chart for details).    MDM Rules/Calculators/A&P                            Patient Vitals for the past 24 hrs:  BP Temp Temp src Pulse Resp SpO2 Height Weight  09/11/20 1049 (!) 182/75 -- -- 69 19 98 % -- --  09/11/20 1000 (!) 184/69 -- -- 63 12 97 % -- --  09/11/20 0930 (!) 177/84 -- -- 66 16 98 % -- --  09/11/20 0856 -- -- -- 66 13 97 % -- --  09/11/20 0845 -- -- -- 73 15 99 % -- --  09/11/20 0842 (!) 204/89 99.4 F (37.4 C) Oral 75 14 98 % '5\' 5"'$  (1.651 m) 65.8 kg  09/11/20 0827 -- -- -- -- -- -- '5\' 5"'$  (1.651 m) 67.1 kg  09/11/20 0810 (!) 192/100 98 F (36.7 C) Oral 87 18 99 % -- --    1:30 PM- reevaluation with update and discussion. After initial assessment and treatment, an updated evaluation reveals sitting in chair comfortable, complaining of midthoracic pain only.  Findings discussed with  patient, and son at bedside, all questions answered. Daleen Bo   Medical Decision Making:  This patient is presenting for evaluation of upper back pain, which does require a range of treatment options, and is a complaint that involves a moderate risk of morbidity and mortality. The differential diagnoses include ACS, pneumonia, pneumothorax, musculoskeletal pain, nonspecific chest pain. I decided to review old records, and in summary elderly female with history of diabetes, MI, hyperlipidemia and high blood pressure presenting with nonspecific discomfort.  I did not require additional historical information from anyone.  Clinical Laboratory Tests Ordered, included CBC, Metabolic panel, and troponin I . Review indicates normal except hemoglobin elevated, sodium low, chloride low, glucose high, creatinine high, GFR low. Radiologic Tests Ordered, included chest x-ray.  I independently Visualized: Radiograph images, which show no acute abnormality  Cardiac Monitor Tracing which shows normal sinus rhythm    Critical Interventions-clinical evaluation, laboratory testing, radiography, observation and reassessment.  Confirmed normal dopplerable pulses, right radial artery.  After These Interventions, the Patient was reevaluated and was found without worsening symptoms, and nonspecific symptoms.  Screening evaluation does not indicate any acute abnormalities including cardiac, pulmonary or metabolic.  Suspect mild dehydration contributing to malaise.  Back pain likely multifactorial, without significant abnormality on imaging done showing the thoracic spine.  No indication for hospitalization or further ED intervention.  CRITICAL CARE-no Performed by: Daleen Bo  Nursing Notes  Reviewed/ Care Coordinated Applicable Imaging Reviewed Interpretation of Laboratory Data incorporated into ED treatment  The patient appears reasonably screened and/or stabilized for discharge and I doubt any other  medical condition or other [facility] requiring further screening, evaluation, or treatment in the ED at this time prior to discharge.  Plan: Home Medications-continue usual, take Tylenol for pain; Home Treatments-rest, heat to affected area; return here if the recommended treatment, does not improve the symptoms; Recommended follow up-PCP follow-up as needed.     Final Clinical Impression(s) / ED Diagnoses Final diagnoses:  Midline thoracic back pain, unspecified chronicity    Rx / DC Orders ED Discharge Orders     None        Daleen Bo, MD 09/11/20 1730

## 2020-09-11 NOTE — ED Notes (Signed)
Dr. Eulis Foster notified of pts symptoms.

## 2020-09-11 NOTE — ED Triage Notes (Addendum)
Pt presents from home with c/o of right hand weakness, back pain and overall weakness. Pt states she has had right arm weakness x15 years due to vasculitis and reduced blood flow to the arm. Pt states this morning she was unable to clasp her bra, however she was able to do this task yesterday. Pt also states that her right hand has been "trying to cramp up all week." Pt states she has had back pain all week she has experienced upper backpain . Pt states the pain is in the area she had a kyphoplasty, however pt states the pain is similar to the pain she had when she had an MI. Pt had MI 1989. Pt denies CP, SHOB, N/V, or abdominal pain. Pt also endorses overall weakness that started this week.Pt is A&Ox4. Pt states she has taken ibuprofen for her back and dulled the pain. Pt states she has not taken any of her daily medications.

## 2020-09-11 NOTE — Discharge Instructions (Addendum)
There are no signs of serious problems today.  Try taking Tylenol every 4 hours and use heat on your back for pain.  Call your primary care doctor for follow-up appointment to be seen about the cramping and weakness of your right arm.

## 2020-09-15 ENCOUNTER — Encounter: Payer: Self-pay | Admitting: Nurse Practitioner

## 2020-09-15 ENCOUNTER — Other Ambulatory Visit: Payer: Self-pay

## 2020-09-15 ENCOUNTER — Non-Acute Institutional Stay: Payer: PPO | Admitting: Nurse Practitioner

## 2020-09-15 DIAGNOSIS — E782 Mixed hyperlipidemia: Secondary | ICD-10-CM | POA: Diagnosis not present

## 2020-09-15 DIAGNOSIS — E1100 Type 2 diabetes mellitus with hyperosmolarity without nonketotic hyperglycemic-hyperosmolar coma (NKHHC): Secondary | ICD-10-CM | POA: Diagnosis not present

## 2020-09-15 DIAGNOSIS — I10 Essential (primary) hypertension: Secondary | ICD-10-CM | POA: Diagnosis not present

## 2020-09-15 DIAGNOSIS — R29898 Other symptoms and signs involving the musculoskeletal system: Secondary | ICD-10-CM | POA: Insufficient documentation

## 2020-09-15 DIAGNOSIS — I2510 Atherosclerotic heart disease of native coronary artery without angina pectoris: Secondary | ICD-10-CM

## 2020-09-15 DIAGNOSIS — M546 Pain in thoracic spine: Secondary | ICD-10-CM

## 2020-09-15 DIAGNOSIS — I252 Old myocardial infarction: Secondary | ICD-10-CM

## 2020-09-15 DIAGNOSIS — E871 Hypo-osmolality and hyponatremia: Secondary | ICD-10-CM | POA: Diagnosis not present

## 2020-09-15 NOTE — Assessment & Plan Note (Signed)
Worsened in the right arm/hand weakness/numbness(onset after vasculitis many years ago, treated with Methotrexate worse since 09/11/20),  [place] R Radial artery in ED. Declined CT head/neck, okay to check ESR CRP

## 2020-09-15 NOTE — Assessment & Plan Note (Signed)
blood pressure is controlled, takes Atenolol '50mg'$  qd, Enalapril '20mg'$  bid,  HCTZ '25mg'$  qd. Bun/creat 22/1.01 09/11/20

## 2020-09-15 NOTE — Assessment & Plan Note (Signed)
Na 130 09/11/20, needs f/u CMP/eGFR, update CMP/eGFR one week.

## 2020-09-15 NOTE — Assessment & Plan Note (Signed)
ED eval 09/11/20 for midline thoracic back pain and right hand numbness, [place] R Radial artery, CXR, CBC, BMP, Troponin, EKG unremarkable except Na 130.

## 2020-09-15 NOTE — Assessment & Plan Note (Signed)
T2DM, stable, on Metformin '500mg'$  bid  desires no routine ophthalmology, podiatrist visit. Hgb a1c 6.8, TSH 3.12 05/19/20

## 2020-09-15 NOTE — Assessment & Plan Note (Signed)
,   on Atrovastatin '20mg'$  qd, LDL 53 05/19/20

## 2020-09-15 NOTE — Progress Notes (Signed)
Location:   clinic [place]   Place of Service:  Clinic (12) Provider: Marlana Latus NP  Code Status: DNR Goals of Care: [place] 09/15/2020  Does Patient Have a Medical Advance Directive? Yes  Type of Advance Directive [place]  Does patient want to make changes to medical advance directive? No - Patient declined  Copy of [place] in Chart? Yes - validated most recent copy scanned in chart (See row information)  Would patient like information on creating a medical advance directive? -     Chief Complaint  Patient presents with   Hospitalization Follow-up    Emergency room follow up      HPI: Patient is a 87 y.o. female seen today for medical management of chronic diseases.     ED eval 09/11/20 for midline thoracic back pain and right arm/hand weakness/numbness(onset after vasculitis many years ago, treated with Methotrexate worse since 09/11/20),  [place] R Radial artery, CXR, CBC, BMP, Troponin, EKG unremarkable except Na 130. Declined workups CT head, neck, shoulder, Cardiology, therapy. Takes OTC Ibuprofen.   Hyponatremia, Na 130 09/11/20, needs f/u CMP/eGFR   HTN, blood pressure is controlled, takes Atenolol 80m qd, Enalapril 256mbid,  HCTZ 2540md. Bun/creat 22/1.01 09/11/20             CAD, Hx of heart attack, takes Lipitor, Enalapril, ASA             Hyperlipidemia, on Atrovastatin 72m7m, LDL 53 05/19/20             T2DM, stable, on Metformin 500mg2m  desires no routine ophthalmology, podiatrist visit. Hgb a1c 6.8, TSH 3.12 05/19/20   Past Medical History:  Diagnosis Date   Coronary arteriosclerosis    Diabetes mellitus without complication (HCC)    Type 2    Hyperlipidemia    Hypertension    [facility] ([facility] Past Surgical History:  Procedure Laterality Date   APPENDECTOMY  1988   Dr. PeterCollier SalinaRONARY ARTERY BYPASS GRAFT  1990   EYE SURGERY  2012   Dr, FowleVickki MuffPHOPLASTY  2015   Dr. DurraDonivan ScullAllergies  Allergen Reactions   Shrimp [facility]]    Poison Ivy Extract Rash    Allergies as of 09/15/2020       Reactions   Shrimp [shellfish Allergy]    Poison Ivy Extract Rash        Medication List        Accurate as of September 15, 2020  4:01 PM. If you have any questions, ask your nurse or doctor.          aspirin EC 81 MG tablet Take 81 mg by mouth daily.   atenolol 50 MG tablet Commonly known as: TENORMIN Take 1 tablet (50 mg total) by mouth daily.   atorvastatin 20 MG tablet Commonly known as: LIPITOR Take 1 tablet (20 mg total) by mouth daily.   Calcium Carbonate-Vitamin D3 600-400 MG-UNIT Tabs Take 600 mg by mouth 2 (two) times daily with a meal.   enalapril 20 MG tablet Commonly known as: VASOTEC TAKE 1 TABLET BY MOUTH TWICE DAILY.   hydrochlorothiazide 25 MG tablet Commonly known as: HYDRODIURIL Take 1 tablet (25 mg total) by mouth daily.   ibuprofen 200 MG tablet Commonly known as: ADVIL Take 200 mg by mouth 2 (two) times daily as needed for fever, headache or mild pain.  metFORMIN 500 MG tablet Commonly known as: GLUCOPHAGE Take 1 tablet (500 mg total) by mouth 2 (two) times daily with a meal.   triamcinolone cream 0.1 % Commonly known as: KENALOG APPLY TO AFFECTED AREA TWICE A DAY.        Review of Systems:  Review of Systems  Constitutional:  Negative for activity change, fatigue and fever.  HENT:  Positive for hearing loss. Negative for congestion and voice change.   Eyes:  Negative for visual disturbance.  Respiratory:  Positive for cough. Negative for shortness of breath and wheezing.        Occasional hacking cough for 30 more years since started ACEI  Cardiovascular:  Positive for leg swelling. Negative for chest pain and palpitations.  Gastrointestinal:  Negative for abdominal pain, constipation and diarrhea.  Genitourinary:  Negative for difficulty urinating, dysuria and urgency.       Pessary, nocturnal  urination x1 average  Musculoskeletal:  Positive for arthralgias, back pain and gait problem.  Skin:  Negative for color change.  Neurological:  Positive for weakness. Negative for speech difficulty and headaches.       C/o weak in right arm/hand for years but getting worse, muscle strength 5/5  Psychiatric/Behavioral:  Negative for behavioral problems and sleep disturbance. The patient is not nervous/anxious.        Sometimes stay awake after bathroom, but taking a nap during day.    Health Maintenance  Topic Date Due   Zoster Vaccines- Shingrix (1 of 2) Never done   COVID-19 Vaccine (4 - Booster for Moderna series) 05/15/2020   TETANUS/TDAP  01/29/2021 (Originally 07/08/1942)   HEMOGLOBIN A1C  11/18/2020   FOOT EXAM  01/28/2021   DEXA SCAN  Completed   PNA vac Low Risk Adult  Completed   HPV VACCINES  Aged Out   INFLUENZA VACCINE  Discontinued   OPHTHALMOLOGY EXAM  Discontinued    Physical Exam: Vitals:   09/15/20 1320  BP: 128/70  Pulse: 85  Resp: 18  Temp: (!) 95.7 F (35.4 C)  SpO2: 97%  Weight: 144 lb 12.8 oz (65.7 kg)  Height: 5' 5"  (1.651 m)   Body mass index is 24.1 kg/m. Physical Exam Vitals and nursing note reviewed.  Constitutional:      Appearance: Normal appearance.  HENT:     Head: Normocephalic and atraumatic.     Mouth/Throat:     Mouth: Mucous membranes are moist.  Eyes:     Extraocular Movements: Extraocular movements intact.     Conjunctiva/sclera: Conjunctivae normal.     Pupils: Pupils are equal, round, and reactive to light.  Cardiovascular:     Rate and Rhythm: Normal rate and regular rhythm.     Heart sounds: No murmur heard. Pulmonary:     Breath sounds: No rhonchi or rales.  Abdominal:     General: Bowel sounds are normal.     Palpations: Abdomen is soft.     Tenderness: There is no abdominal tenderness.  Musculoskeletal:     Cervical back: Normal range of motion and neck supple.     Right lower leg: Edema present.     Left lower  leg: Edema present.     Comments: Trace edema in ankles, hardly noticeable.   Skin:    General: Skin is warm and dry.  Neurological:     General: No focal deficit present.     Mental Status: She is alert and oriented to person, place, and time. Mental status is at baseline.  Motor: Weakness present.     Gait: Gait abnormal.     Comments: C/o right arm/hand weakness, muscle strength 5/5  Psychiatric:        Mood and Affect: Mood normal.        Behavior: Behavior normal.        Thought Content: Thought content normal.        Judgment: Judgment normal.    Labs reviewed: Basic Metabolic Panel: Recent Labs    05/19/20 0715 09/11/20 0855  NA 139 130*  K 3.6 3.6  CL 99 94*  CO2 28 26  GLUCOSE 117* 165*  BUN 26* 22  CREATININE 0.84 1.01*  CALCIUM 9.7 9.4  TSH 3.12  --    Liver Function Tests: Recent Labs    05/19/20 0715 09/11/20 0855  AST 14 16  ALT 12 15  ALKPHOS  --  63  BILITOT 0.8 0.9  PROT 6.3 7.0  ALBUMIN  --  4.2   No results for input(s): LIPASE, AMYLASE in the last 8760 hours. No results for input(s): AMMONIA in the last 8760 hours. CBC: Recent Labs    05/19/20 0715 09/11/20 0855  WBC 6.9 8.7  NEUTROABS 4,382 6.6  HGB 15.3 16.1*  HCT 46.5* 48.2*  MCV 93.4 91.3  PLT 187 189   Lipid Panel: Recent Labs    05/19/20 0715  CHOL 128  HDL 56  LDLCALC 53  TRIG 107  CHOLHDL 2.3   Lab Results  Component Value Date   HGBA1C 6.8 (H) 05/19/2020    Procedures since last visit: DG Chest 2 View  Result Date: 09/11/2020 CLINICAL DATA:  Pain.  Right arm weakness. EXAM: CHEST - 2 VIEW COMPARISON:  None. FINDINGS: The heart size and mediastinal contours are within normal limits. Aortic atherosclerosis. Both lungs are clear. The visualized skeletal structures are unremarkable. IMPRESSION: 1. No active cardiopulmonary disease. 2. Aortic atherosclerosis. Electronically Signed   By: Kerby Moors M.D.   On: 09/11/2020 09:22     Assessment/Plan  Hyponatremia Na 130 09/11/20, needs f/u CMP/eGFR, update CMP/eGFR one week.   Thoracic back pain ED eval 09/11/20 for midline thoracic back pain and right hand numbness, [place] R Radial artery, CXR, CBC, BMP, Troponin, EKG unremarkable except Na 130.   HTN (hypertension) blood pressure is controlled, takes Atenolol 38m qd, Enalapril 267mbid,  HCTZ 2589md. Bun/creat 22/1.01 09/11/20  CAD (coronary artery disease) Hx of heart attack, takes Lipitor, Enalapril, ASA  Hyperlipidemia , on Atrovastatin 41m2m, LDL 53 05/19/20  Type 2 diabetes mellitus (HCC) T2DM, stable, on Metformin 500mg59m  desires no routine ophthalmology, podiatrist visit. Hgb a1c 6.8, TSH 3.12 05/19/20  History of heart attack On and off, thought it might be another heart attach, but its not stated its not her concern anymore. ED eval 09/11/20 for midline thoracic back pain and right arm/hand weakness/numbness(onset after vasculitis many years ago, treated with Methotrexate worse since 09/11/20),  US R [place] artery, CXR, CBC, BMP, Troponin, EKG unremarkable except Na 130. Declined future workups:  CT head, neck, back, shoulder, Cardiology referral, or  therapy. Takes OTC Ibuprofen.   Right arm weakness Worsened in the right arm/hand weakness/numbness(onset after vasculitis many years ago, treated with Methotrexate worse since 09/11/20),  US R Kore[place]ial artery in ED. Declined CT head/neck, okay to check ESR CRP    Labs/tests ordered: CMP/eGFR, CRP, ESR 09/20/20  Next appt:  12/01/2020

## 2020-09-15 NOTE — Assessment & Plan Note (Signed)
On and off, thought it might be another heart attach, but its not stated its not her concern anymore. ED eval 09/11/20 for midline thoracic back pain and right arm/hand weakness/numbness(onset after vasculitis many years ago, treated with Methotrexate worse since 09/11/20),  [place] R Radial artery, CXR, CBC, BMP, Troponin, EKG unremarkable except Na 130. Declined future workups:  CT head, neck, back, shoulder, Cardiology referral, or  therapy. Takes OTC Ibuprofen.

## 2020-09-15 NOTE — Assessment & Plan Note (Signed)
Hx of heart attack, takes Lipitor, Enalapril, ASA

## 2020-09-16 DIAGNOSIS — R29898 Other symptoms and signs involving the musculoskeletal system: Secondary | ICD-10-CM | POA: Diagnosis not present

## 2020-09-20 ENCOUNTER — Other Ambulatory Visit: Payer: Self-pay

## 2020-09-20 DIAGNOSIS — R29898 Other symptoms and signs involving the musculoskeletal system: Secondary | ICD-10-CM

## 2020-09-21 LAB — COMPLETE METABOLIC PANEL WITH GFR
AG Ratio: 2 (calc) (ref 1.0–2.5)
ALT: 9 U/L (ref 6–29)
AST: 12 U/L (ref 10–35)
Albumin: 4.1 g/dL (ref 3.6–5.1)
Alkaline phosphatase (APISO): 59 U/L (ref 37–153)
BUN: 16 mg/dL (ref 7–25)
CO2: 30 mmol/L (ref 20–32)
Calcium: 10 mg/dL (ref 8.6–10.4)
Chloride: 97 mmol/L — ABNORMAL LOW (ref 98–110)
Creat: 0.89 mg/dL (ref 0.60–0.95)
Globulin: 2.1 g/dL (calc) (ref 1.9–3.7)
Glucose, Bld: 138 mg/dL — ABNORMAL HIGH (ref 65–99)
Potassium: 3.8 mmol/L (ref 3.5–5.3)
Sodium: 137 mmol/L (ref 135–146)
Total Bilirubin: 0.8 mg/dL (ref 0.2–1.2)
Total Protein: 6.2 g/dL (ref 6.1–8.1)
eGFR: 60 mL/min/{1.73_m2} (ref >=60)

## 2020-09-21 LAB — C-REACTIVE PROTEIN: CRP: <0.2 mg/L (ref <8.0)

## 2020-09-21 LAB — SEDIMENTATION RATE: Sed Rate: 2 mm/h (ref 0–30)

## 2020-10-20 ENCOUNTER — Encounter: Payer: Self-pay | Admitting: Nurse Practitioner

## 2020-10-20 ENCOUNTER — Other Ambulatory Visit: Payer: Self-pay

## 2020-10-20 ENCOUNTER — Ambulatory Visit (INDEPENDENT_AMBULATORY_CARE_PROVIDER_SITE_OTHER): Payer: PPO | Admitting: Nurse Practitioner

## 2020-10-20 VITALS — BP 142/72 | HR 67 | Temp 97.3°F | Resp 17 | Ht 65.0 in | Wt 149.0 lb

## 2020-10-20 DIAGNOSIS — Z0000 Encounter for general adult medical examination without abnormal findings: Secondary | ICD-10-CM | POA: Diagnosis not present

## 2020-10-20 NOTE — Progress Notes (Signed)
Subjective:   Jessica Walters is a 87 y.o. female who presents for Medicare Annual (Subsequent) preventive examination in clinic @ [facility]        Objective:    Today's Vitals   10/20/20 1331  BP: (!) 142/72  Pulse: 67  Resp: 17  Temp: (!) 97.3 F (36.3 C)  SpO2: 97%  Weight: 149 lb (67.6 kg)  Height: '5\' 5"'$  (1.651 m)   Body mass index is 24.79 kg/m.  Advanced Directives 10/20/2020 09/15/2020 09/11/2020 05/26/2020 07/24/2018 04/21/2018 01/13/2018  Does Patient Have a Medical Advance Directive? Yes Yes Yes Yes Yes No Yes  Type of Astronomer Power of Attorney Living will;Healthcare Power of [place] of [place]  Does patient want to make changes to medical advance directive? No - Patient declined No - Patient declined - No - Patient declined No - Patient declined - No - Patient declined  Copy of [place] in Chart? Yes - validated most recent copy scanned in chart (See row information) Yes - validated most recent copy scanned in chart (See row information) - Yes - validated most recent copy scanned in chart (See row information) Yes - validated most recent copy scanned in chart (See row information) - Yes - validated most recent copy scanned in chart (See row information)  Would patient like information on creating a medical advance directive? - - - - - No - Patient declined -    Current Medications (verified) Outpatient Encounter Medications as of 10/20/2020  Medication Sig   aspirin EC 81 MG tablet Take 81 mg by mouth daily.   atenolol (TENORMIN) 50 MG tablet Take 1 tablet (50 mg total) by mouth daily.   atorvastatin (LIPITOR) 20 MG tablet Take 1 tablet (20 mg total) by mouth daily.   Calcium Carbonate-Vitamin D3 600-400 MG-UNIT TABS Take 600 mg by mouth 2 (two) times daily with a meal.   enalapril (VASOTEC) 20 MG tablet TAKE 1 TABLET BY MOUTH TWICE  DAILY. (Patient taking differently: Take 20 mg by mouth 2 (two) times daily.)   hydrochlorothiazide (HYDRODIURIL) 25 MG tablet Take 1 tablet (25 mg total) by mouth daily.   ibuprofen (ADVIL) 200 MG tablet Take 200 mg by mouth 2 (two) times daily as needed for fever, headache or mild pain.   metFORMIN (GLUCOPHAGE) 500 MG tablet Take 1 tablet (500 mg total) by mouth 2 (two) times daily with a meal.   triamcinolone cream (KENALOG) 0.1 % APPLY TO AFFECTED AREA TWICE A DAY.   No facility-administered encounter medications on file as of 10/20/2020.    Allergies (verified) Shrimp [shellfish allergy] and Poison ivy extract   History: Past Medical History:  Diagnosis Date   Coronary arteriosclerosis    Diabetes mellitus without complication (HCC)    Type 2    Hyperlipidemia    Hypertension    Myocardial infarction [facility])    Past Surgical History:  Procedure Laterality Date   APPENDECTOMY  1988   Dr. Collier Salina   CORONARY ARTERY BYPASS GRAFT  1990   EYE SURGERY  2012   Dr, Vickki Muff   KYPHOPLASTY  2015   Dr. Donivan Scull   Family History  Problem Relation Age of Onset   Hypertension Mother    Lung cancer Father    Social History   Socioeconomic History   Marital status: Widowed    Spouse name: Not on file   Number of  children: Not on file   Years of education: Not on file   Highest education level: Not on file  Occupational History   Not on file  Tobacco Use   Smoking status: Never   Smokeless tobacco: Never  Substance and Sexual Activity   Alcohol use: Yes    Comment: 2-3 weekly   Drug use: Not on file   Sexual activity: Not on file  Other Topics Concern   Not on file  Social History Narrative   Not on file   Social Determinants of Health   Financial Resource Strain: Not on file  Food Insecurity: Not on file  Transportation Needs: Not on file  Physical Activity: Not on file  Stress: Not on file  Social Connections: Not on file    Tobacco Counseling Counseling given: Not  Answered   Clinical Intake:  Pre-visit preparation completed: Yes  Pain : No/denies pain     BMI - recorded: 24.79 Nutritional Status: BMI 25 -29 Overweight Nutritional Risks: None Diabetes: Yes CBG done?: No Did pt. bring in CBG monitor from home?: No  How often do you need to have someone help you when you read instructions, pamphlets, or other written materials from your doctor or pharmacy?: 1 - Never What is the last grade level you completed in school?: one year college  Diabetic?yes  Interpreter Needed?: No  Information entered by :: Ziv Welchel Bretta Bang NP   Activities of Daily Living No flowsheet data found.  Patient Care Team: Dawana Asper X, NP as PCP - General (Internal Medicine) Klayten Jolliff X, NP as Nurse Practitioner (Internal Medicine)  Indicate any recent Medical Services you may have received from other than [facility] providers in the past year (date may be approximate).     Assessment:   This is a routine wellness examination for Jessica Walters.  Hearing/Vision screen No results found.  Dietary issues and exercise activities discussed:     Goals Addressed   None    Depression Screen PHQ 2/9 Scores 10/20/2020 08/20/2019 01/13/2018 01/10/2017  PHQ - 2 Score 0 0 0 0  PHQ- 9 Score - 2 - -    Fall Risk Fall Risk  10/20/2020 05/26/2020 01/29/2020 10/22/2019 08/20/2019  Falls in the past year? 0 0 0 0 0  Number falls in past yr: 0 0 0 0 0  Injury with Fall? 0 0 - - -  Risk for fall due to : No Fall Risks - - - -  Follow up Falls evaluation completed - - - -    FALL RISK PREVENTION PERTAINING TO THE HOME:  Any stairs in or around the home? No  If so, are there any without handrails? No  Home free of loose throw rugs in walkways, pet beds, electrical cords, etc? No  Adequate lighting in your home to reduce risk of falls? Yes   ASSISTIVE DEVICES UTILIZED TO PREVENT FALLS:  Life alert? No  Use of a cane, walker or w/c? No  Grab bars in the bathroom? Yes  Shower chair or  bench in shower? Yes  Elevated toilet seat or a handicapped toilet? Yes   TIMED UP AND GO:  Was the test performed? No .   Gait slow and steady without use of assistive device  Cognitive Function: MMSE - Mini Mental State Exam 10/20/2020 08/20/2019 01/10/2017  Orientation to time '5 5 5  '$ Orientation to Place '4 5 5  '$ Registration '3 3 3  '$ Attention/ Calculation '5 5 5  '$ Recall 3 3 2  Language- name 2 objects '2 2 2  '$ Language- repeat '1 1 1  '$ Language- follow 3 step command '3 3 3  '$ Language- read & follow direction '1 1 1  '$ Write a sentence '1 1 1  '$ Copy design '1 1 1  '$ Total score '29 30 29        '$ Immunizations Immunization History  Administered Date(s) Administered   Influenza, High Dose Seasonal PF 11/27/2016, 11/14/2017, 11/25/2019   Influenza, Seasonal, Injecte, Preservative Fre 12/27/2010, 12/21/2011   Influenza,inj,Quad PF,6+ Mos 01/14/2013, 12/01/2013, 01/25/2015   Influenza-Unspecified 11/12/2017   Moderna Sars-Covid-2 Vaccination 06/08/2019, 07/06/2019, 01/15/2020   Pneumococcal Conjugate-13 07/27/2014   Pneumococcal Polysaccharide-23 11/07/2004   TDAP as needed.   Flu Vaccine status: Due, Education has been provided regarding the importance of this vaccine. Advised may receive this vaccine at local pharmacy or Health Dept. Aware to provide a copy of the vaccination record if obtained from local pharmacy or Health Dept. Verbalized acceptance and understanding.  Pneumococcal vaccine status: Up to date  Covid-19 vaccine status: Completed vaccines  Qualifies for Shingles Vaccine? Yes, declined    Screening Tests Health Maintenance  Topic Date Due   Zoster Vaccines- Shingrix (1 of 2) Never done   COVID-19 Vaccine (4 - Booster for Moderna series) 05/15/2020   TETANUS/TDAP  01/29/2021 (Originally 07/08/1942)   HEMOGLOBIN A1C  11/18/2020   FOOT EXAM  01/28/2021   DEXA SCAN  Completed   PNA vac Low Risk Adult  Completed   HPV VACCINES  Aged Out   INFLUENZA VACCINE  Discontinued    OPHTHALMOLOGY EXAM  Discontinued    Health Maintenance  Health Maintenance Due  Topic Date Due   Zoster Vaccines- Shingrix (1 of 2) Never done   COVID-19 Vaccine (4 - Booster for Moderna series) 05/15/2020    Colorectal cancer screening: No longer required.   Mammogram status: No longer required due to declined.  DEXA decline.   Lung Cancer Screening: (Low Dose CT Chest recommended if Age 72-80 years, 30 pack-year currently smoking OR have quit w/in 15years.) does not qualify.   Lung Cancer Screening Referral: declined.   Additional Screening:  Hepatitis C Screening: does not qualify, declined  Vision Screening: Recommended annual ophthalmology exams for early detection of glaucoma and other disorders of the eye. Is the patient up to date with their annual eye exam?  No declined.  Who is the provider or what is the name of the office in which the patient attends annual eye exams? No longer seeing Ophthalmology.  If pt is not established with a provider, would they like to be referred to a provider to establish care? Dr. Tona Sensing.   Dental Screening: Recommended annual dental exams for proper oral hygiene  Community Resource Referral / Chronic Care Management: CRR required this visit?  No   CCM required this visit?  No      Plan:     I have personally reviewed and noted the following in the patient's chart:   Medical and social history Use of alcohol, tobacco or illicit drugs  Current medications and supplements including opioid prescriptions.  Functional ability and status Nutritional status Physical activity Advanced directives List of other physicians Hospitalizations, surgeries, and ER visits in previous 12 months Vitals Screenings to include cognitive, depression, and falls Referrals and appointments  In addition, I have reviewed and discussed with patient certain preventive protocols, quality metrics, and best practice recommendations. A  written personalized care plan for preventive services as well as general preventive  health recommendations were provided to patient.     Shaena Parkerson X Caius Silbernagel, NP   10/20/2020

## 2020-12-01 ENCOUNTER — Encounter: Payer: Self-pay | Admitting: Nurse Practitioner

## 2020-12-01 ENCOUNTER — Other Ambulatory Visit: Payer: Self-pay

## 2020-12-01 ENCOUNTER — Non-Acute Institutional Stay: Payer: PPO | Admitting: Nurse Practitioner

## 2020-12-01 DIAGNOSIS — I10 Essential (primary) hypertension: Secondary | ICD-10-CM | POA: Diagnosis not present

## 2020-12-01 DIAGNOSIS — I2510 Atherosclerotic heart disease of native coronary artery without angina pectoris: Secondary | ICD-10-CM

## 2020-12-01 DIAGNOSIS — E871 Hypo-osmolality and hyponatremia: Secondary | ICD-10-CM

## 2020-12-01 DIAGNOSIS — M546 Pain in thoracic spine: Secondary | ICD-10-CM

## 2020-12-01 DIAGNOSIS — E1100 Type 2 diabetes mellitus with hyperosmolarity without nonketotic hyperglycemic-hyperosmolar coma (NKHHC): Secondary | ICD-10-CM

## 2020-12-01 DIAGNOSIS — E782 Mixed hyperlipidemia: Secondary | ICD-10-CM

## 2020-12-01 NOTE — Assessment & Plan Note (Signed)
Hx of heart attack, takes Lipitor, Enalapril, ASA

## 2020-12-01 NOTE — Assessment & Plan Note (Addendum)
stable, on Metformin 500mg  bid  desires no routine ophthalmology, podiatrist visit. Hgb a1c 6.8, TSH 3.12 05/19/20. Update Hgb a1c, CBC.diff, TSH 9 months.

## 2020-12-01 NOTE — Progress Notes (Signed)
Location:   clinic [name]   Place of Service:  Clinic (12) Provider: Marlana Latus NP  Code Status: DNR Goals of Care: [place] 12/01/2020  Does Patient Have a Medical Advance Directive? Yes  Type of Advance Directive [name]  Does patient want to make changes to medical advance directive? No - Patient declined  Copy of [name] in Chart? Yes - validated most recent copy scanned in chart (See row information)  Would patient like information on creating a medical advance directive? -     Chief Complaint  Patient presents with   Medical Management of Chronic Issues    Patient returns to the clinic for follow up.    Quality Metric Gaps    Hemoglobin A1C, Shingrix    HPI: Patient is a 87 y.o. female seen today for medical management of chronic diseases.                  HTN, blood pressure is controlled, takes Atenolol 64m qd, Enalapril 272mbid,  HCTZ 2544md. Bun/creat 16/0.89 09/16/20             CAD, Hx of heart attack, takes Lipitor, Enalapril, ASA             Hyperlipidemia, on Atrovastatin 84m16m, LDL 53 05/19/20             T2DM, stable, on Metformin 500mg69m  desires no routine ophthalmology, podiatrist visit. Hgb a1c 6.8, TSH 3.12 05/19/20  ED eval 09/11/20 for midline thoracic back pain and right arm/hand weakness/numbness(onset after vasculitis many years ago, treated with Methotrexate worse since 09/11/20),  US R Koreadial artery, CXR, CBC, BMP, Troponin, EKG unremarkable except Na 130. Declined workups CT head, neck, shoulder,Cardiology, therapy. Takes OTC Ibuprofen.    Hyponatremia, resolved, Na 137 09/16/20  Past Medical History:  Diagnosis Date   Coronary arteriosclerosis    Di[place] complication [place]HCC[place]    Hyperlipidemia    Hypertension    [facility] (HCC)[facility] Past Surgical History:  Procedure Laterality Date   APPENDECTOMY  1988   Dr. PeterCollier SalinaRONARY ARTERY BYPASS GRAFT  1990    EYE SURGERY  2012   Dr, FowleVickki MuffPHOPLASTY  2015   Dr. DurraDonivan Scullllergies  Allergen Reactions   [facility]    Poison Ivy Extract Rash    Allergies as of 12/01/2020       Reactions   Shrimp [shellfish Allergy]    Poison Ivy Extract Rash        Medication List        Accurate as of December 01, 2020 11:59 PM. If you have any questions, ask your nurse or doctor.          aspirin EC 81 MG tablet Take 81 mg by mouth daily.   atenolol 50 MG tablet Commonly known as: TENORMIN Take 1 tablet (50 mg total) by mouth daily.   atorvastatin 20 MG tablet Commonly known as: LIPITOR Take 1 tablet (20 mg total) by mouth daily.   Calcium Carbonate-Vitamin D3 600-400 MG-UNIT Tabs Take 600 mg by mouth 2 (two) times daily with a meal.   enalapril 20 MG tablet Commonly known as: VASOTEC TAKE 1 TABLET BY MOUTH TWICE DAILY.   hydrochlorothiazide 25 MG tablet Commonly known as: HYDRODIURIL Take 1 tablet (25 mg total) by mouth daily.   ibuprofen 200  MG tablet Commonly known as: ADVIL Take 200 mg by mouth 2 (two) times daily as needed for fever, headache or mild pain.   metFORMIN 500 MG tablet Commonly known as: GLUCOPHAGE Take 1 tablet (500 mg total) by mouth 2 (two) times daily with a meal.   triamcinolone cream 0.1 % Commonly known as: KENALOG APPLY TO AFFECTED AREA TWICE A DAY.        Review of Systems:  Review of Systems  Constitutional:  Negative for activity change, fatigue and fever.  HENT:  Positive for hearing loss. Negative for congestion and voice change.   Eyes:  Negative for visual disturbance.  Respiratory:  Positive for cough. Negative for shortness of breath and wheezing.        Occasional hacking cough for 30 more years since started ACEI  Cardiovascular:  Positive for leg swelling. Negative for chest pain and palpitations.  Gastrointestinal:  Negative for abdominal pain and constipation.  Genitourinary:  Negative for  difficulty urinating, dysuria and urgency.       Pessary, nocturnal urination x1 average  Musculoskeletal:  Positive for arthralgias, back pain and gait problem.  Skin:  Negative for color change.  Neurological:  Negative for speech difficulty, weakness and headaches.       Resolved weak in right arm/hand for years but getting worse to her baseline, muscle strength 5/5  Psychiatric/Behavioral:  Negative for behavioral problems and sleep disturbance. The patient is not nervous/anxious.        Sometimes stay awake after bathroom, but taking a nap during day.    Health Maintenance  Topic Date Due   HEMOGLOBIN A1C  11/18/2020   TETANUS/TDAP  01/29/2021 (Originally 07/08/1942)   Zoster Vaccines- Shingrix (1 of 2) 03/03/2024 (Originally 07/09/1973)   FOOT EXAM  01/28/2021   Pneumonia Vaccine 72+ Years old  Completed   DEXA SCAN  Completed   COVID-19 Vaccine  Completed   HPV VACCINES  Aged Out   INFLUENZA VACCINE  Discontinued   OPHTHALMOLOGY EXAM  Discontinued    Physical Exam: Vitals:   12/01/20 1347  BP: (!) 146/98  Pulse: (!) 53  Temp: (!) 96.5 F (35.8 C)  SpO2: 95%  Weight: 151 lb 3.2 oz (68.6 kg)  Height: 5' 5"  (1.651 m)   Body mass index is 25.16 kg/m. Physical Exam Vitals and nursing note reviewed.  Constitutional:      Appearance: Normal appearance.  HENT:     Head: Normocephalic and atraumatic.     Mouth/Throat:     Mouth: Mucous membranes are moist.  Eyes:     Extraocular Movements: Extraocular movements intact.     Conjunctiva/sclera: Conjunctivae normal.     Pupils: Pupils are equal, round, and reactive to light.  Cardiovascular:     Rate and Rhythm: Normal rate and regular rhythm.     Heart sounds: No murmur heard.    Comments: Right radial pulse not felt.  Pulmonary:     Breath sounds: No rhonchi or rales.  Abdominal:     General: Bowel sounds are normal.     Palpations: Abdomen is soft.     Tenderness: There is no abdominal tenderness.   Musculoskeletal:     Cervical back: Normal range of motion and neck supple.     Right lower leg: Edema present.     Left lower leg: Edema present.     Comments: Trace edema in ankles, hardly noticeable.   Skin:    General: Skin is warm and dry.  Neurological:  General: No focal deficit present.     Mental Status: She is alert and oriented to person, place, and time. Mental status is at baseline.     Motor: No weakness.     Gait: Gait abnormal.     Comments: Resolved right arm/hand weakness, muscle strength 5/5  Psychiatric:        Mood and Affect: Mood normal.        Behavior: Behavior normal.        Thought Content: Thought content normal.        Judgment: Judgment normal.    Labs reviewed: Basic Metabolic Panel: Recent Labs    05/19/20 0715 09/11/20 0855 09/16/20 0920  NA 139 130* 137  K 3.6 3.6 3.8  CL 99 94* 97*  CO2 28 26 30   GLUCOSE 117* 165* 138*  BUN 26* 22 16  CREATININE 0.84 1.01* 0.89  CALCIUM 9.7 9.4 10.0  TSH 3.12  --   --    Liver Function Tests: Recent Labs    05/19/20 0715 09/11/20 0855 09/16/20 0920  AST 14 16 12   ALT 12 15 9   ALKPHOS  --  63  --   BILITOT 0.8 0.9 0.8  PROT 6.3 7.0 6.2  ALBUMIN  --  4.2  --    No results for input(s): LIPASE, AMYLASE in the last 8760 hours. No results for input(s): AMMONIA in the last 8760 hours. CBC: Recent Labs    05/19/20 0715 09/11/20 0855  WBC 6.9 8.7  NEUTROABS 4,382 6.6  HGB 15.3 16.1*  HCT 46.5* 48.2*  MCV 93.4 91.3  PLT 187 189   Lipid Panel: Recent Labs    05/19/20 0715  CHOL 128  HDL 56  LDLCALC 53  TRIG 107  CHOLHDL 2.3   Lab Results  Component Value Date   HGBA1C 6.8 (H) 05/19/2020    Procedures since last visit: No results found.  Assessment/Plan  Hyponatremia  resolved, Na 137 09/16/20  Thoracic back pain ED eval 09/11/20 for midline thoracic back pain and right arm/hand weakness/numbness(onset after vasculitis many years ago, treated with Methotrexate worse  since 09/11/20),  [place] R Radial artery, CXR, CBC, BMP, Troponin, EKG unremarkable except Na 130. Declined workups CT head, neck, shoulder,Cardiology, therapy. Takes OTC Ibuprofen.   Type 2 diabetes mellitus (HCC) stable, on Metformin 522m bid  desires no routine ophthalmology, podiatrist visit. Hgb a1c 6.8, TSH 3.12 05/19/20. Update Hgb a1c, CBC.diff, TSH 6 months.   Hyperlipidemia on Atrovastatin 234mqd, LDL 53 05/19/20, update lipid panel.   CAD (coronary artery disease) Hx of heart attack, takes Lipitor, Enalapril, ASA  HTN (hypertension) Loose blood pressure controll,  Bp 150/86 rechecked, takes Atenolol 5090md, Enalapril 37m72md,  HCTZ 25mg72m Bun/creat 16/0.89 09/16/20. Update CMP/eGFR 9 months.    Labs/tests ordered: CBC/diff, CMP/eGFR, lipids, Hgb a1c, TSH 9 months.   Next appt:  9 months.

## 2020-12-01 NOTE — Assessment & Plan Note (Addendum)
on Atrovastatin 20mg  qd, LDL 53 05/19/20, update lipid panel.

## 2020-12-01 NOTE — Assessment & Plan Note (Signed)
ED eval 09/11/20 for midline thoracic back pain and right arm/hand weakness/numbness(onset after vasculitis many years ago, treated with Methotrexate worse since 09/11/20),  [place] R Radial artery, CXR, CBC, BMP, Troponin, EKG unremarkable except Na 130. Declined workups CT head, neck, shoulder,Cardiology, therapy. Takes OTC Ibuprofen.

## 2020-12-01 NOTE — Assessment & Plan Note (Addendum)
Loose blood pressure controll,  Bp 150/86 rechecked, takes Atenolol 36m qd, Enalapril 262mbid,  HCTZ 25103md. Bun/creat 16/0.89 09/16/20. Update CMP/eGFR 9 months.

## 2020-12-01 NOTE — Assessment & Plan Note (Signed)
resolved, Na 137 09/16/20

## 2020-12-05 ENCOUNTER — Encounter: Payer: Self-pay | Admitting: Nurse Practitioner

## 2020-12-07 ENCOUNTER — Encounter: Payer: Self-pay | Admitting: Family

## 2020-12-07 ENCOUNTER — Ambulatory Visit (INDEPENDENT_AMBULATORY_CARE_PROVIDER_SITE_OTHER): Payer: PPO | Admitting: Family

## 2020-12-07 ENCOUNTER — Other Ambulatory Visit: Payer: Self-pay

## 2020-12-07 VITALS — BP 120/82 | HR 74 | Temp 98.0°F | Resp 16 | Ht 65.0 in | Wt 145.2 lb

## 2020-12-07 DIAGNOSIS — L03119 Cellulitis of unspecified part of limb: Secondary | ICD-10-CM

## 2020-12-07 DIAGNOSIS — M79674 Pain in right toe(s): Secondary | ICD-10-CM | POA: Diagnosis not present

## 2020-12-07 DIAGNOSIS — M7989 Other specified soft tissue disorders: Secondary | ICD-10-CM | POA: Diagnosis not present

## 2020-12-07 MED ORDER — DOXYCYCLINE HYCLATE 100 MG PO TABS: 100.0000 mg | ORAL_TABLET | Freq: Two times a day (BID) | ORAL | 0 refills | Status: AC

## 2020-12-07 MED ORDER — ACETAMINOPHEN 500 MG PO TABS
500.0000 mg | ORAL_TABLET | Freq: Three times a day (TID) | ORAL | 0 refills | Status: DC | PRN
Start: 2020-12-07 — End: 2022-06-07

## 2020-12-07 NOTE — Progress Notes (Signed)
Provider: Marlowe Sax FNP-C  Mast, Man X, NP  Patient Care Team: Mast, Man X, NP as PCP - General (Internal Medicine) Mast, Man X, NP as Nurse Practitioner (Internal Medicine)  Extended Emergency Contact Information Primary Emergency Contact: Phillips,Hal Address: [address]          [place], [place] [address] [place] Phone: [phone] Relation: Son  Code Status:  DNR Goals of care: Advanced Directive information Advanced Directives 12/07/2020  Does Patient Have a Medical Advance Directive? Yes  Type of Advance Directive [place]  Does patient want to make changes to medical advance directive? No - Patient declined  Copy of [place] in Chart? Yes - validated most recent copy scanned in chart (See row information)  Would patient like information on creating a medical advance directive? -     Chief Complaint  Patient presents with   Acute Visit    Patient complains of swollen foot that's red/purplish in color.     HPI:  Pt is a 87 y.o. female seen today for an acute visit for evaluation of right foot pain and swelling x 3 days.she is here with her son Hardin Negus.she resides in [place] woke up on Monday morning could not bear weight on right foot due to pain.top/ankle area of the foot has been red- purple in color and hot to touch.Has had to use a cane due to pain but usually walks without any device. Does not recall spraining the foot or injuries.Not sure if something bit her at night. She denies any fever or chills.    Past Medical History:  Diagnosis Date   Coronary arteriosclerosis    Diabetes mellitus without complication ([place])    Type 2    Hyperlipidemia    Hypertension    Myocardial infarction [facility])    Past Surgical History:  Procedure Laterality Date   APPENDECTOMY  1988   Dr. Collier Salina   CORONARY ARTERY BYPASS GRAFT  1990   EYE SURGERY  2012   Dr, Vickki Muff    KYPHOPLASTY  2015   Dr. Donivan Scull    Allergies  Allergen Reactions   Shrimp Dia Sitter Allergy]    Poison Ivy Extract Rash    Outpatient Encounter Medications as of 12/07/2020  Medication Sig   acetaminophen (TYLENOL) 500 MG tablet Take 1 tablet (500 mg total) by mouth every 8 (eight) hours as needed for moderate pain.   aspirin EC 81 MG tablet Take 81 mg by mouth daily.   atenolol (TENORMIN) 50 MG tablet Take 1 tablet (50 mg total) by mouth daily.   atorvastatin (LIPITOR) 20 MG tablet Take 1 tablet (20 mg total) by mouth daily.   Calcium Carbonate-Vitamin D3 600-400 MG-UNIT TABS Take 600 mg by mouth 2 (two) times daily with a meal.   doxycycline (VIBRA-TABS) 100 MG tablet Take 1 tablet (100 mg total) by mouth 2 (two) times daily for 7 days.   enalapril (VASOTEC) 20 MG tablet TAKE 1 TABLET BY MOUTH TWICE DAILY.   hydrochlorothiazide (HYDRODIURIL) 25 MG tablet Take 1 tablet (25 mg total) by mouth daily.   ibuprofen (ADVIL) 200 MG tablet Take 200 mg by mouth 2 (two) times daily as needed for fever, headache or mild pain.   metFORMIN (GLUCOPHAGE) 500 MG tablet Take 1 tablet (500 mg total) by mouth 2 (two) times daily with a meal.   triamcinolone cream (KENALOG) 0.1 % APPLY TO AFFECTED AREA TWICE A DAY.   [  DISCONTINUED] Cholecalciferol (VITAMIN D3 PO) Take by mouth in the morning.   No facility-administered encounter medications on file as of 12/07/2020.    Review of Systems  Constitutional:  Negative for appetite change, chills, fatigue, fever and unexpected weight change.  Eyes:  Negative for pain, discharge, redness, itching and visual disturbance.  Respiratory:  Negative for cough, chest tightness, shortness of breath and wheezing.   Cardiovascular:  Negative for chest pain and palpitations.       Right foot/ankle swollen   Musculoskeletal:  Positive for gait problem. Negative for arthralgias and back pain.       Right foot/ankle swelling   Skin:  Negative for color change, pallor  and rash.  Neurological:  Negative for dizziness, weakness, light-headedness, numbness and headaches.  Hematological:  Does not bruise/bleed easily.  Psychiatric/Behavioral:  Negative for agitation, behavioral problems, confusion and sleep disturbance. The patient is not nervous/anxious.    Immunization History  Administered Date(s) Administered   Influenza, High Dose Seasonal PF 11/27/2016, 11/14/2017, 11/25/2019   Influenza, Seasonal, Injecte, Preservative Fre 12/27/2010, 12/21/2011   Influenza,inj,Quad PF,6+ Mos 01/14/2013, 12/01/2013, 01/25/2015   Influenza-Unspecified 11/12/2017   Moderna Sars-Covid-2 Vaccination 06/08/2019, 07/06/2019, 01/15/2020   Pfizer Covid-19 Vaccine Bivalent Booster 21yrs & up 11/01/2020   Pneumococcal Conjugate-13 07/27/2014   Pneumococcal Polysaccharide-23 11/07/2004   Pertinent  Health Maintenance Due  Topic Date Due   HEMOGLOBIN A1C  11/18/2020   FOOT EXAM  01/28/2021   DEXA SCAN  Completed   INFLUENZA VACCINE  Discontinued   OPHTHALMOLOGY EXAM  Discontinued   Fall Risk 05/26/2020 09/11/2020 10/20/2020 12/01/2020 12/07/2020  Falls in the past year? 0 - 0 0 0  Was there an injury with Fall? 0 - 0 0 0  Fall Risk Category Calculator 0 - 0 0 0  Fall Risk Category Low - Low Low Low  Patient Fall Risk Level Low fall risk Moderate fall risk Low fall risk Low fall risk Low fall risk  Patient at Risk for Falls Due to - - No Fall Risks - No Fall Risks  Fall risk Follow up - - Falls evaluation completed Falls evaluation completed -   Functional Status Survey:    Vitals:   12/07/20 1521  BP: 120/82  Pulse: 74  Resp: 16  Temp: 98 F (36.7 C)  SpO2: 94%  Weight: 145 lb 3.2 oz (65.9 kg)  Height: 5\' 5"  (1.651 m)   Body mass index is 24.16 kg/m. Physical Exam Vitals reviewed.  Constitutional:      General: She is not in acute distress.    Appearance: Normal appearance. She is normal weight. She is not ill-appearing or diaphoretic.  HENT:     Head:  Normocephalic.  Eyes:     General: No scleral icterus.       Right eye: No discharge.        Left eye: No discharge.     Conjunctiva/sclera: Conjunctivae normal.     Pupils: Pupils are equal, round, and reactive to light.  Cardiovascular:     Rate and Rhythm: Normal rate and regular rhythm.     Pulses: Normal pulses.     Heart sounds: Normal heart sounds. No murmur heard.   No friction rub. No gallop.  Pulmonary:     Effort: Pulmonary effort is normal. No respiratory distress.     Breath sounds: Normal breath sounds. No wheezing, rhonchi or rales.  Chest:     Chest wall: No tenderness.  Musculoskeletal:  General: No swelling. Normal range of motion.     Right foot: Normal range of motion and normal capillary refill. Swelling and tenderness present. No crepitus. Normal pulse.     Left foot: Normal.  Skin:    General: Skin is warm and dry.     Coloration: Skin is not pale.     Findings: No bruising, erythema, lesion or rash.     Comments: Right foot dorsal skin erythema,warm and tender to touch diffuse swelling to entire foot and malleolus area.  Neurological:     Mental Status: She is alert and oriented to person, place, and time.     Motor: No weakness.     Gait: Gait abnormal.     Comments: Walking with a right hand cane   Psychiatric:        Mood and Affect: Mood normal.        Speech: Speech normal.        Behavior: Behavior normal.        Thought Content: Thought content normal.        Judgment: Judgment normal.    Labs reviewed: Recent Labs    05/19/20 0715 09/11/20 0855 09/16/20 0920  NA 139 130* 137  K 3.6 3.6 3.8  CL 99 94* 97*  CO2 28 26 30   GLUCOSE 117* 165* 138*  BUN 26* 22 16  CREATININE 0.84 1.01* 0.89  CALCIUM 9.7 9.4 10.0   Recent Labs    05/19/20 0715 09/11/20 0855 09/16/20 0920  AST 14 16 12   ALT 12 15 9   ALKPHOS  --  63  --   BILITOT 0.8 0.9 0.8  PROT 6.3 7.0 6.2  ALBUMIN  --  4.2  --    Recent Labs    05/19/20 0715  09/11/20 0855  WBC 6.9 8.7  NEUTROABS 4,382 6.6  HGB 15.3 16.1*  HCT 46.5* 48.2*  MCV 93.4 91.3  PLT 187 189   Lab Results  Component Value Date   TSH 3.12 05/19/2020   Lab Results  Component Value Date   HGBA1C 6.8 (H) 05/19/2020   Lab Results  Component Value Date   CHOL 128 05/19/2020   HDL 56 05/19/2020   LDLCALC 53 05/19/2020   TRIG 107 05/19/2020   CHOLHDL 2.3 05/19/2020    Significant Diagnostic Results in last 30 days:  No results found.  Assessment/Plan 1. Pain and swelling of toe of right foot Right foot dorsal skin erythema,warm and tender to touch diffuse swelling to entire foot and malleolus area. Unclear etiology reports no injury or sprain.erythema on dorsal foot concerning for insect bite.will rule out gout given swelling on entire malleolus and erythema.  - CBC with Differential/Platelet - Uric Acid - Sedimentation rate - C-reactive protein - DG Foot Complete Left; Future - acetaminophen (TYLENOL) 500 MG tablet; Take 1 tablet (500 mg total) by mouth every 8 (eight) hours as needed for moderate pain.  Dispense: 30 tablet; Refill: 0  2. Cellulitis of foot Concerning for insect bite as above verse gout ? Or sprain. Will treat with doxycycline as below.SE discussed.  - doxycycline (VIBRA-TABS) 100 MG tablet; Take 1 tablet (100 mg total) by mouth 2 (two) times daily for 7 days.  Dispense: 14 tablet; Refill: 0  Family/ staff Communication: Reviewed plan of care with patient and son verbalized understanding.  Labs/tests ordered:  - CBC with Differential/Platelet - Uric Acid - Sedimentation rate - C-reactive protein - DG Foot Complete Left; Future  Next Appointment: As needed  if symptoms worsen or fail to improve    Sandrea Hughs, NP

## 2020-12-07 NOTE — Patient Instructions (Addendum)
-   Take Tylenol 500 mg tablet one by mouth every 8 hours as needed for pain   - Please get right foot  X-ray at [place] at [facility] then will call you with results.

## 2020-12-08 ENCOUNTER — Ambulatory Visit
Admission: RE | Admit: 2020-12-08 | Discharge: 2020-12-08 | Disposition: A | Discharge status: Home / Self Care | Payer: PPO | Source: Ambulatory Visit | Attending: Family | Admitting: Family

## 2020-12-08 DIAGNOSIS — M7989 Other specified soft tissue disorders: Secondary | ICD-10-CM | POA: Diagnosis not present

## 2020-12-08 DIAGNOSIS — M79671 Pain in right foot: Secondary | ICD-10-CM | POA: Diagnosis not present

## 2020-12-08 LAB — CBC WITH DIFFERENTIAL/PLATELET
Absolute Monocytes: 528 cells/uL (ref 200–950)
Basophils Absolute: 62 cells/uL (ref 0–200)
Basophils Relative: 0.7 %
Eosinophils Absolute: 106 cells/uL (ref 15–500)
Eosinophils Relative: 1.2 %
HCT: 44.7 % (ref 35.0–45.0)
Hemoglobin: 15.1 g/dL (ref 11.7–15.5)
Lymphs Abs: 1998 cells/uL (ref 850–3900)
MCH: 31.3 pg (ref 27.0–33.0)
MCHC: 33.8 g/dL (ref 32.0–36.0)
MCV: 92.5 fL (ref 80.0–100.0)
MPV: 10.6 fL (ref 7.5–12.5)
Monocytes Relative: 6 %
Neutro Abs: 6107 cells/uL (ref 1500–7800)
Neutrophils Relative %: 69.4 %
Platelets: 226 10*3/uL (ref 140–400)
RBC: 4.83 10*6/uL (ref 3.80–5.10)
RDW: 12.2 % (ref 11.0–15.0)
Total Lymphocyte: 22.7 %
WBC: 8.8 10*3/uL (ref 3.8–10.8)

## 2020-12-08 LAB — C-REACTIVE PROTEIN: CRP: <0.2 mg/L (ref <8.0)

## 2020-12-08 LAB — URIC ACID: Uric Acid, Serum: 4.7 mg/dL (ref 2.5–7.0)

## 2020-12-08 LAB — SEDIMENTATION RATE: Sed Rate: 9 mm/h (ref 0–30)

## 2020-12-15 ENCOUNTER — Ambulatory Visit (INDEPENDENT_AMBULATORY_CARE_PROVIDER_SITE_OTHER): Payer: PPO

## 2020-12-15 ENCOUNTER — Other Ambulatory Visit: Payer: Self-pay | Admitting: Podiatry

## 2020-12-15 ENCOUNTER — Other Ambulatory Visit: Payer: Self-pay

## 2020-12-15 ENCOUNTER — Ambulatory Visit: Payer: PPO | Admitting: Podiatry

## 2020-12-15 DIAGNOSIS — L02611 Cutaneous abscess of right foot: Secondary | ICD-10-CM

## 2020-12-15 DIAGNOSIS — L02619 Cutaneous abscess of unspecified foot: Secondary | ICD-10-CM | POA: Diagnosis not present

## 2020-12-15 DIAGNOSIS — L03119 Cellulitis of unspecified part of limb: Secondary | ICD-10-CM | POA: Diagnosis not present

## 2020-12-15 DIAGNOSIS — R609 Edema, unspecified: Secondary | ICD-10-CM | POA: Diagnosis not present

## 2020-12-15 DIAGNOSIS — L03115 Cellulitis of right lower limb: Secondary | ICD-10-CM | POA: Diagnosis not present

## 2020-12-15 MED ORDER — SULFAMETHOXAZOLE-TRIMETHOPRIM 800-160 MG PO TABS: 1.0000 | ORAL_TABLET | Freq: Two times a day (BID) | ORAL | 0 refills | Status: DC

## 2020-12-16 ENCOUNTER — Telehealth: Payer: Self-pay | Admitting: Podiatry

## 2020-12-16 ENCOUNTER — Other Ambulatory Visit: Payer: Self-pay | Admitting: Podiatry

## 2020-12-16 DIAGNOSIS — L02619 Cutaneous abscess of unspecified foot: Secondary | ICD-10-CM

## 2020-12-16 NOTE — Telephone Encounter (Signed)
Patient son called to follow up on the ultra sound on her right foot. Please advise

## 2020-12-16 NOTE — Telephone Encounter (Signed)
Called [facility] and they stated they can't schedule anyone yet for [place] since they don't have a radiologist on list yet. They will schedule patient until they have a radiologist on schedule

## 2020-12-18 ENCOUNTER — Other Ambulatory Visit: Payer: Self-pay

## 2020-12-18 ENCOUNTER — Ambulatory Visit
Admission: RE | Admit: 2020-12-18 | Discharge: 2020-12-18 | Disposition: A | Discharge status: Home / Self Care | Payer: PPO | Source: Ambulatory Visit | Attending: Podiatry | Admitting: Podiatry

## 2020-12-18 DIAGNOSIS — M7989 Other specified soft tissue disorders: Secondary | ICD-10-CM | POA: Diagnosis not present

## 2020-12-18 DIAGNOSIS — R600 Localized edema: Secondary | ICD-10-CM | POA: Diagnosis not present

## 2020-12-18 DIAGNOSIS — M19071 Primary osteoarthritis, right ankle and foot: Secondary | ICD-10-CM | POA: Diagnosis not present

## 2020-12-18 DIAGNOSIS — E119 Type 2 diabetes mellitus without complications: Secondary | ICD-10-CM | POA: Diagnosis not present

## 2020-12-18 DIAGNOSIS — L02619 Cutaneous abscess of unspecified foot: Secondary | ICD-10-CM

## 2020-12-18 NOTE — Progress Notes (Signed)
Subjective:   Patient ID: Jessica Walters, female   DOB: 87 y.o.   MRN: [id number]   HPI 87 year old female presents the office with her son for concerns of right foot swelling, discoloration which occurred 10 days ago.  She has no known injury.  She states that she went to sleep and she was normal when she woke up she had pain to her foot when she stood up.  She had some swelling or redness.  She was seen by her primary care physician she was started on doxycycline which she states did not help.  She said the swelling and redness has been getting worse.  She has no fevers or chills.  She has no other concerns today.   Review of Systems  All other systems reviewed and are negative.  Past Medical History:  Diagnosis Date   Coronary arteriosclerosis    Diabetes mellitus without complication ([place])    Type 2    Hyperlipidemia    Hypertension    Myocardial infarction [facility])     Past Surgical History:  Procedure Laterality Date   APPENDECTOMY  1988   Dr. Collier Salina   CORONARY ARTERY BYPASS GRAFT  1990   EYE SURGERY  2012   Dr, Vickki Muff   KYPHOPLASTY  2015   Dr. Donivan Scull     Current Outpatient Medications:    sulfamethoxazole-trimethoprim (BACTRIM DS) 800-160 MG tablet, Take 1 tablet by mouth 2 (two) times daily., Disp: 20 tablet, Rfl: 0   acetaminophen (TYLENOL) 500 MG tablet, Take 1 tablet (500 mg total) by mouth every 8 (eight) hours as needed for moderate pain., Disp: 30 tablet, Rfl: 0   aspirin EC 81 MG tablet, Take 81 mg by mouth daily., Disp: , Rfl:    atenolol (TENORMIN) 50 MG tablet, Take 1 tablet (50 mg total) by mouth daily., Disp: 90 tablet, Rfl: 2   atorvastatin (LIPITOR) 20 MG tablet, Take 1 tablet (20 mg total) by mouth daily., Disp: 90 tablet, Rfl: 2   Calcium Carbonate-Vitamin D3 600-400 MG-UNIT TABS, Take 600 mg by mouth 2 (two) times daily with a meal., Disp: 180 tablet, Rfl: 2   enalapril (VASOTEC) 20 MG tablet, TAKE 1 TABLET BY MOUTH TWICE DAILY., Disp: 180 tablet, Rfl:  2   hydrochlorothiazide (HYDRODIURIL) 25 MG tablet, Take 1 tablet (25 mg total) by mouth daily., Disp: 90 tablet, Rfl: 2   ibuprofen (ADVIL) 200 MG tablet, Take 200 mg by mouth 2 (two) times daily as needed for fever, headache or mild pain., Disp: , Rfl:    metFORMIN (GLUCOPHAGE) 500 MG tablet, Take 1 tablet (500 mg total) by mouth 2 (two) times daily with a meal., Disp: 180 tablet, Rfl: 2   triamcinolone cream (KENALOG) 0.1 %, APPLY TO AFFECTED AREA TWICE A DAY., Disp: 30 g, Rfl: 3  Allergies  Allergen Reactions   Shrimp [Shellfish Allergy]    Poison Ivy Extract Rash          Objective:  Physical Exam  General: AAO x3, NAD  Dermatological: There is edema and erythema noted to the dorsal lateral aspect of the right foot.  The erythema is blanchable.  There is 1 central area where she has tenderness which appears to be a small fluid collection.  See the plan below however was able to get any purulence from this.  There is some swelling present on the foot on the right side.  There is no open sore identified.  Vascular: Dorsalis Pedis artery and Posterior Tibial artery pedal  pulses are palpable bilateral with immedate capillary fill time. There is no pain with calf compression, swelling, warmth, erythema.   Neruologic: Grossly intact via light touch bilateral.   Musculoskeletal: No gross boney pedal deformities bilateral. No pain, crepitus, or limitation noted with foot and ankle range of motion bilateral. Muscular strength 5/5 in all groups tested bilateral.  Gait: Unassisted, Nonantalgic.       Assessment:   Right foot concern for abscess     Plan:  -Treatment options discussed including all alternatives, risks, and complications -Etiology of symptoms were discussed -Repeat x-rays obtained reviewed.  No sign of acute fracture or osteomyelitis.  Reviewed the blood work that she had done last week by her primary care physician as well.  Systemically she seems to be well.   Concern about possible abscess.  I discussed with her try to aspirate the area of concern.  She wants to proceed with this.  I prepped the skin with alcohol and 3 cc of lidocaine, Marcaine was infiltrated in a regional block fashion.  Once anesthetized I used a 18-gauge needle to try to aspirate the area of concern for abscess.  Only bloody drainage expressed.  No purulence.  Wound culture obtained of this.  She tolerated the procedure well.  Compression bandage was applied.  Encouraged elevation.  We will start Bactrim.  There is any signs or symptoms worsen infection she is to report directly emergency department.  I will order an ultrasound to further evaluate the area.   Trula Slade DPM

## 2020-12-20 ENCOUNTER — Telehealth: Payer: Self-pay | Admitting: *Deleted

## 2020-12-20 NOTE — Telephone Encounter (Signed)
April w/ [facility] calling to clarify the order sent for a MRI right foot wo contrast(joint structure )will only check for muscle,tendon tears, ligaments, hamstrings,plantar fasciitis,ganglion cyst,hematoma.Not sure what looking for, maybe an ABI which could actually check for cellulitis, abscess or  something in that area. Please advise.

## 2020-12-21 LAB — WOUND CULTURE
MICRO NUMBER:: 12591517
RESULT:: NO GROWTH
SPECIMEN QUALITY:: ADEQUATE

## 2020-12-21 LAB — HOUSE ACCOUNT TRACKING

## 2020-12-22 ENCOUNTER — Ambulatory Visit: Payer: PPO | Admitting: Podiatry

## 2020-12-22 ENCOUNTER — Other Ambulatory Visit: Payer: Self-pay

## 2020-12-22 DIAGNOSIS — L03119 Cellulitis of unspecified part of limb: Secondary | ICD-10-CM

## 2020-12-22 DIAGNOSIS — L02619 Cutaneous abscess of unspecified foot: Secondary | ICD-10-CM

## 2020-12-25 NOTE — Progress Notes (Signed)
Subjective: 87 year old female presents the office today with her son for follow-up evaluation of right foot swelling, infection.  Since I last saw her I started her on the Bactrim which she states did very well and she is no longer having any pain or issues with her right foot.  She did have stopped the medicine last night due to GI side effects.  She did not take it this morning.  Denies any fevers or chills.  No other concerns.  Objective: AAO x3, NAD DP/PT pulses palpable bilaterally, CRT less than 3 seconds Erythema and edema to the right foot is substantially improved.  There is still some slight residual edema present.  Along the area of the concern for abscess a small mount of fluid still present.  There is no crepitation.  No tenderness palpation.  No open lesions or pre-ulcerative lesions.  No pain with calf compression, swelling, warmth, erythema  Assessment: Resolving cellulitis right foot  Plan: -All treatment options discussed with the patient including all alternatives, risks, complications.  -She did complete 1 week of antibiotics and has done well from this.  Hold off on further antibiotics for now given the GI side effects however if symptoms recur we will need to restart antibiotics.  I reviewed the MRI with her which did reveal likely old hematoma but no abscess noted.  I encouraged elevation, compression. -Monitor for any clinical signs or symptoms of infection and directed to call the office immediately should any occur or go to the ER. -Patient encouraged to call the office with any questions, concerns, change in symptoms.   Trula Slade DPM

## 2020-12-26 ENCOUNTER — Other Ambulatory Visit: Payer: Self-pay | Admitting: Podiatry

## 2020-12-26 DIAGNOSIS — L03119 Cellulitis of unspecified part of limb: Secondary | ICD-10-CM

## 2021-03-20 ENCOUNTER — Other Ambulatory Visit: Payer: Self-pay | Admitting: Nurse Practitioner

## 2021-03-20 DIAGNOSIS — I252 Old myocardial infarction: Secondary | ICD-10-CM

## 2021-03-20 DIAGNOSIS — E785 Hyperlipidemia, unspecified: Secondary | ICD-10-CM

## 2021-03-20 DIAGNOSIS — I10 Essential (primary) hypertension: Secondary | ICD-10-CM

## 2021-03-20 DIAGNOSIS — E1100 Type 2 diabetes mellitus with hyperosmolarity without nonketotic hyperglycemic-hyperosmolar coma (NKHHC): Secondary | ICD-10-CM

## 2021-06-01 ENCOUNTER — Non-Acute Institutional Stay: Payer: PPO | Admitting: Nurse Practitioner

## 2021-06-01 ENCOUNTER — Encounter: Payer: Self-pay | Admitting: Nurse Practitioner

## 2021-06-01 DIAGNOSIS — I10 Essential (primary) hypertension: Secondary | ICD-10-CM

## 2021-06-01 DIAGNOSIS — I2510 Atherosclerotic heart disease of native coronary artery without angina pectoris: Secondary | ICD-10-CM

## 2021-06-01 DIAGNOSIS — E1100 Type 2 diabetes mellitus with hyperosmolarity without nonketotic hyperglycemic-hyperosmolar coma (NKHHC): Secondary | ICD-10-CM | POA: Diagnosis not present

## 2021-06-01 DIAGNOSIS — E782 Mixed hyperlipidemia: Secondary | ICD-10-CM

## 2021-06-01 NOTE — Progress Notes (Signed)
? ?Location:  clinic [place]  ?  ?Place of Service:  Clinic (12) ?Provider: Marlana Latus NP ? ?Code Status: DNR ?Goals of Care: [place] ? ?  12/07/2020  ?  3:13 PM  ?Advanced Directives  ?Does Patient Have a Medical Advance Directive? Yes  ?Type of Advance Directive Healthcare Power of Attorney  ?Does patient want to make changes to medical advance directive? No - Patient declined  ?Copy of [place] in Chart? Yes - validated most recent copy scanned in chart (See row information)  ? ? ? ?Chief Complaint  ?Patient presents with  ? Medical Management of Chronic Issues  ?  Patient presents today for a 6 month follow-up  ? ? ?HPI: Patient is a 88 y.o. female seen today for medical management of chronic diseases.   ? ?  ? HTN, blood pressure is controlled, takes Atenolol, Enalapril, HCTZ, Bun/creat 16/0.89 09/16/20 ?            CAD, Hx of heart attack, takes Lipitor, Enalapril, ASA ?            Hyperlipidemia, on Atrovastatin 57m qd, LDL 53 05/19/20 ?            T2DM, stable, on Metformin 5085mbid  desires no routine ophthalmology, podiatrist visit. Hgb a1c 6.8, TSH 3.12 05/19/20 ?            ED eval 09/11/20 for midline thoracic back pain and right arm/hand weakness/numbness(onset after vasculitis many years ago, treated with Methotrexate worse since 09/11/20),  [place] Radial artery, CXR, CBC, BMP, Troponin, EKG unremarkable except Na 130. Declined workups CT head, neck, shoulder,Cardiology, therapy. Takes OTC Ibuprofen.  ?             Hyponatremia, resolved, Na 137 09/16/20 ?Past Medical History:  ?Diagnosis Date  ? Coronary arteriosclerosis   ? Diabetes mellitus without complication ([place]  ? Type 2   ? Hyperlipidemia   ? Hypertension   ? [facility]  ? ? ?Past Surgical History:  ?Procedure Laterality Date  ? APPENDECTOMY  1988  ? Dr. PeCollier Salina? CORONARY ARTERY BYPASS GRAFT  1990  ? EYE SURGERY  2012  ? Dr, FoVickki Muff? KYPHOPLASTY  2015  ? Dr. DuDonivan Scull? ? ?Allergies  ?Allergen Reactions  ? Bactrim  [Sulfamethoxazole-Trimethoprim] Diarrhea  ? Shrimp [Shellfish Allergy]   ? Poison Ivy Extract Rash  ? ? ?Allergies as of 06/01/2021   ? ?   Reactions  ? Bactrim [sulfamethoxazole-trimethoprim] Diarrhea  ? Shrimp [shellfish Allergy]   ? Poison Ivy Extract Rash  ? ?  ? ?  ?Medication List  ?  ? ?  ? Accurate as of June 01, 2021 11:59 PM. If you have any questions, ask your nurse or doctor.  ?  ?  ? ?  ? ?acetaminophen 500 MG tablet ?Commonly known as: TYLENOL ?Take 1 tablet (500 mg total) by mouth every 8 (eight) hours as needed for moderate pain. ?  ?aspirin EC 81 MG tablet ?Take 81 mg by mouth daily. ?  ?atenolol 50 MG tablet ?Commonly known as: TENORMIN ?TAKE ONE TABLET BY MOUTH DAILY. ?  ?atorvastatin 20 MG tablet ?Commonly known as: LIPITOR ?TAKE ONE TABLET BY MOUTH DAILY. ?  ?Calcium Carbonate-Vitamin D3 600-400 MG-UNIT Tabs ?Take 600 mg by mouth 2 (two) times daily with a meal. ?  ?enalapril 20 MG tablet ?Commonly known as: VASOTEC ?TAKE ONE TABLET BY MOUTH TWICE DAILY ?  ?hydrochlorothiazide 25  MG tablet ?Commonly known as: HYDRODIURIL ?TAKE ONE TABLET BY MOUTH DAILY. ?  ?ibuprofen 200 MG tablet ?Commonly known as: ADVIL ?Take 200 mg by mouth 2 (two) times daily as needed for fever, headache or mild pain. ?  ?metFORMIN 500 MG tablet ?Commonly known as: GLUCOPHAGE ?TAKE ONE TABLET BY MOUTH TWICE DAILY. ?  ?triamcinolone cream 0.1 % ?Commonly known as: KENALOG ?APPLY TO AFFECTED AREA TWICE A DAY. ?  ? ?  ? ? ?Review of Systems:  ?Review of Systems  ?Constitutional:  Negative for activity change, fatigue and fever.  ?HENT:  Positive for hearing loss. Negative for congestion and voice change.   ?Eyes:  Negative for visual disturbance.  ?Respiratory:  Positive for cough. Negative for shortness of breath and wheezing.   ?     Occasional hacking cough for 30 more years since started ACEI  ?Cardiovascular:  Positive for leg swelling. Negative for chest pain and palpitations.  ?Gastrointestinal:  Negative for abdominal  pain and constipation.  ?Genitourinary:  Negative for dysuria and urgency.  ?     Pessary, nocturnal urination x1 average  ?Musculoskeletal:  Positive for arthralgias, back pain and gait problem.  ?Skin:  Negative for color change.  ?Neurological:  Negative for speech difficulty, weakness and headaches.  ?Psychiatric/Behavioral:  Positive for sleep disturbance. Negative for behavioral problems. The patient is not nervous/anxious.   ?     Sometimes stay awake after bathroom, but taking a nap during day.   ? ?Health Maintenance  ?Topic Date Due  ? TETANUS/TDAP  Never done  ? HEMOGLOBIN A1C  11/18/2020  ? FOOT EXAM  01/28/2021  ? Zoster Vaccines- Shingrix (1 of 2) 03/03/2024 (Originally 07/09/1973)  ? Pneumonia Vaccine 69+ Years old  Completed  ? DEXA SCAN  Completed  ? COVID-19 Vaccine  Completed  ? HPV VACCINES  Aged Out  ? INFLUENZA VACCINE  Discontinued  ? OPHTHALMOLOGY EXAM  Discontinued  ? ? ?Physical Exam: ?Vitals:  ? 06/01/21 1408  ?BP: 126/82  ?Pulse: 88  ?Temp: (!) 97.5 ?F (36.4 ?C)  ?SpO2: 97%  ?Weight: 141 lb (64 kg)  ? ?Body mass index is 23.46 kg/m?Marland Kitchen ?Physical Exam ?Vitals and nursing note reviewed.  ?Constitutional:   ?   Appearance: Normal appearance.  ?HENT:  ?   Head: Normocephalic and atraumatic.  ?   Mouth/Throat:  ?   Mouth: Mucous membranes are moist.  ?Eyes:  ?   Extraocular Movements: Extraocular movements intact.  ?   Conjunctiva/sclera: Conjunctivae normal.  ?   Pupils: Pupils are equal, round, and reactive to light.  ?Cardiovascular:  ?   Rate and Rhythm: Normal rate and regular rhythm.  ?   Heart sounds: No murmur heard. ?   Comments: Right radial pulse not felt.  ?Pulmonary:  ?   Breath sounds: No rhonchi or rales.  ?Abdominal:  ?   General: Bowel sounds are normal.  ?   Palpations: Abdomen is soft.  ?   Tenderness: There is no abdominal tenderness.  ?Musculoskeletal:  ?   Cervical back: Normal range of motion and neck supple.  ?   Right lower leg: Edema present.  ?   Left lower leg: Edema  present.  ?   Comments: Trace edema in ankles, hardly noticeable.   ?Skin: ?   General: Skin is warm and dry.  ?Neurological:  ?   General: No focal deficit present.  ?   Mental Status: She is alert and oriented to person, place, and time. Mental  status is at baseline.  ?   Motor: No weakness.  ?   Gait: Gait abnormal.  ?   Comments: Resolved right arm/hand weakness, muscle strength 5/5  ?Psychiatric:     ?   Mood and Affect: Mood normal.     ?   Behavior: Behavior normal.     ?   Thought Content: Thought content normal.     ?   Judgment: Judgment normal.  ? ? ?Labs reviewed: ?Basic Metabolic Panel: ?Recent Labs  ?  09/11/20 ?4859 09/16/20 ?0920  ?NA 130* 137  ?K 3.6 3.8  ?CL 94* 97*  ?CO2 26 30  ?GLUCOSE 165* 138*  ?BUN 22 16  ?CREATININE 1.01* 0.89  ?CALCIUM 9.4 10.0  ? ?Liver Function Tests: ?Recent Labs  ?  09/11/20 ?0855 09/16/20 ?0920  ?AST 16 12  ?ALT 15 9  ?ALKPHOS 63  --   ?BILITOT 0.9 0.8  ?PROT 7.0 6.2  ?ALBUMIN 4.2  --   ? ?No results for input(s): LIPASE, AMYLASE in the last 8760 hours. ?No results for input(s): AMMONIA in the last 8760 hours. ?CBC: ?Recent Labs  ?  09/11/20 ?0855 12/07/20 ?1610  ?WBC 8.7 8.8  ?NEUTROABS 6.6 6,107  ?HGB 16.1* 15.1  ?HCT 48.2* 44.7  ?MCV 91.3 92.5  ?PLT 189 226  ? ?Lipid Panel: ?No results for input(s): CHOL, HDL, LDLCALC, TRIG, CHOLHDL, LDLDIRECT in the last 8760 hours. ?Lab Results  ?Component Value Date  ? HGBA1C 6.8 (H) 05/19/2020  ? ? ?Procedures since last visit: ?No results found. ? ?Assessment/Plan ? ?HTN (hypertension) ?blood pressure is controlled, takes Atenolol, Enalapril, HCTZ, Bun/creat 16/0.89 09/16/20, update CMP/eGFR ? ?CAD (coronary artery disease) ?Hx of heart attack, takes Lipitor, Enalapril, ASA ? ?Hyperlipidemia ? on Atrovastatin 34m qd, LDL 53 05/19/20, update lipid panel.  ? ?Type 2 diabetes mellitus ([place] ? stable, on Metformin 502mbid  desires no routine ophthalmology, podiatrist visit. Hgb a1c 6.8, TSH 3.12 05/19/20, update Hgb a1c,  TSH ? ? ?Labs/tests ordered:  Hgb a1c, TSH, CBC/diff, Lipid panel.  ?Next appt:  5 months ?  ?

## 2021-06-01 NOTE — Assessment & Plan Note (Signed)
blood pressure is controlled, takes Atenolol, Enalapril, HCTZ, Bun/creat 16/0.89 09/16/20, update CMP/eGFR ?

## 2021-06-01 NOTE — Assessment & Plan Note (Signed)
on Atrovastatin '20mg'$  qd, LDL 53 05/19/20, update lipid panel.  ?

## 2021-06-01 NOTE — Assessment & Plan Note (Signed)
stable, on Metformin '500mg'$  bid  desires no routine ophthalmology, podiatrist visit. Hgb a1c 6.8, TSH 3.12 05/19/20, update Hgb a1c, TSH ?

## 2021-06-01 NOTE — Assessment & Plan Note (Signed)
Hx of heart attack, takes Lipitor, Enalapril, ASA ?

## 2021-06-02 ENCOUNTER — Encounter: Payer: Self-pay | Admitting: Nurse Practitioner

## 2021-09-20 DIAGNOSIS — R32 Unspecified urinary incontinence: Secondary | ICD-10-CM | POA: Diagnosis not present

## 2021-09-20 DIAGNOSIS — I252 Old myocardial infarction: Secondary | ICD-10-CM | POA: Diagnosis not present

## 2021-09-20 DIAGNOSIS — Z7982 Long term (current) use of aspirin: Secondary | ICD-10-CM | POA: Diagnosis not present

## 2021-09-20 DIAGNOSIS — I10 Essential (primary) hypertension: Secondary | ICD-10-CM | POA: Diagnosis not present

## 2021-09-20 DIAGNOSIS — E1142 Type 2 diabetes mellitus with diabetic polyneuropathy: Secondary | ICD-10-CM | POA: Diagnosis not present

## 2021-09-20 DIAGNOSIS — E785 Hyperlipidemia, unspecified: Secondary | ICD-10-CM | POA: Diagnosis not present

## 2021-09-20 DIAGNOSIS — Z9849 Cataract extraction status, unspecified eye: Secondary | ICD-10-CM | POA: Diagnosis not present

## 2021-09-20 DIAGNOSIS — E1169 Type 2 diabetes mellitus with other specified complication: Secondary | ICD-10-CM | POA: Diagnosis not present

## 2021-09-20 DIAGNOSIS — I2510 Atherosclerotic heart disease of native coronary artery without angina pectoris: Secondary | ICD-10-CM | POA: Diagnosis not present

## 2021-11-28 ENCOUNTER — Other Ambulatory Visit: Payer: PPO

## 2021-11-28 ENCOUNTER — Encounter: Payer: Self-pay | Admitting: Nurse Practitioner

## 2021-11-28 DIAGNOSIS — E1100 Type 2 diabetes mellitus with hyperosmolarity without nonketotic hyperglycemic-hyperosmolar coma (NKHHC): Secondary | ICD-10-CM | POA: Diagnosis not present

## 2021-11-28 DIAGNOSIS — E871 Hypo-osmolality and hyponatremia: Secondary | ICD-10-CM | POA: Diagnosis not present

## 2021-11-28 DIAGNOSIS — N183 Chronic kidney disease, stage 3 unspecified: Secondary | ICD-10-CM | POA: Insufficient documentation

## 2021-11-28 DIAGNOSIS — E782 Mixed hyperlipidemia: Secondary | ICD-10-CM | POA: Diagnosis not present

## 2021-11-29 LAB — COMPLETE METABOLIC PANEL WITH GFR
AG Ratio: 1.5 (calc) (ref 1.0–2.5)
ALT: 9 U/L (ref 6–29)
AST: 13 U/L (ref 10–35)
Albumin: 4 g/dL (ref 3.6–5.1)
Alkaline phosphatase (APISO): 72 U/L (ref 37–153)
BUN/Creatinine Ratio: 23 (calc) — ABNORMAL HIGH (ref 6–22)
BUN: 26 mg/dL — ABNORMAL HIGH (ref 7–25)
CO2: 32 mmol/L (ref 20–32)
Calcium: 9.8 mg/dL (ref 8.6–10.4)
Chloride: 102 mmol/L (ref 98–110)
Creat: 1.12 mg/dL — ABNORMAL HIGH (ref 0.60–0.95)
Globulin: 2.7 g/dL (calc) (ref 1.9–3.7)
Glucose, Bld: 166 mg/dL — ABNORMAL HIGH (ref 65–99)
Potassium: 4.1 mmol/L (ref 3.5–5.3)
Sodium: 143 mmol/L (ref 135–146)
Total Bilirubin: 0.6 mg/dL (ref 0.2–1.2)
Total Protein: 6.7 g/dL (ref 6.1–8.1)
eGFR: 45 mL/min/{1.73_m2} — ABNORMAL LOW (ref >=60)

## 2021-11-29 LAB — LIPID PANEL
Cholesterol: 140 mg/dL (ref <200)
HDL: 54 mg/dL (ref >=50)
LDL Cholesterol (Calc): 67 mg/dL (calc)
Non-HDL Cholesterol (Calc): 86 mg/dL (calc) (ref <130)
Total CHOL/HDL Ratio: 2.6 (calc) (ref <5.0)
Triglycerides: 100 mg/dL (ref <150)

## 2021-11-29 LAB — CBC WITH DIFFERENTIAL/PLATELET
Absolute Monocytes: 481 cells/uL (ref 200–950)
Basophils Absolute: 33 cells/uL (ref 0–200)
Basophils Relative: 0.4 %
Eosinophils Absolute: 158 cells/uL (ref 15–500)
Eosinophils Relative: 1.9 %
HCT: 40.8 % (ref 35.0–45.0)
Hemoglobin: 13.7 g/dL (ref 11.7–15.5)
Lymphs Abs: 2000 cells/uL (ref 850–3900)
MCH: 31.2 pg (ref 27.0–33.0)
MCHC: 33.6 g/dL (ref 32.0–36.0)
MCV: 92.9 fL (ref 80.0–100.0)
MPV: 10.4 fL (ref 7.5–12.5)
Monocytes Relative: 5.8 %
Neutro Abs: 5627 cells/uL (ref 1500–7800)
Neutrophils Relative %: 67.8 %
Platelets: 222 10*3/uL (ref 140–400)
RBC: 4.39 10*6/uL (ref 3.80–5.10)
RDW: 12 % (ref 11.0–15.0)
Total Lymphocyte: 24.1 %
WBC: 8.3 10*3/uL (ref 3.8–10.8)

## 2021-11-29 LAB — HEMOGLOBIN A1C
Hgb A1c MFr Bld: 7.6 % of total Hgb — ABNORMAL HIGH (ref <5.7)
Mean Plasma Glucose: 171 mg/dL
eAG (mmol/L): 9.5 mmol/L

## 2021-11-29 LAB — TSH: TSH: 3.6 mIU/L (ref 0.40–4.50)

## 2021-11-30 ENCOUNTER — Non-Acute Institutional Stay: Payer: PPO | Admitting: Nurse Practitioner

## 2021-11-30 ENCOUNTER — Encounter: Payer: Self-pay | Admitting: Nurse Practitioner

## 2021-11-30 DIAGNOSIS — E782 Mixed hyperlipidemia: Secondary | ICD-10-CM

## 2021-11-30 DIAGNOSIS — E871 Hypo-osmolality and hyponatremia: Secondary | ICD-10-CM | POA: Diagnosis not present

## 2021-11-30 DIAGNOSIS — I10 Essential (primary) hypertension: Secondary | ICD-10-CM

## 2021-11-30 DIAGNOSIS — E1100 Type 2 diabetes mellitus with hyperosmolarity without nonketotic hyperglycemic-hyperosmolar coma (NKHHC): Secondary | ICD-10-CM | POA: Diagnosis not present

## 2021-11-30 DIAGNOSIS — N1831 Chronic kidney disease, stage 3a: Secondary | ICD-10-CM

## 2021-11-30 MED ORDER — EMPAGLIFLOZIN 10 MG PO TABS: 10.0000 mg | ORAL_TABLET | Freq: Every day | ORAL | 2 refills | Status: DC

## 2021-11-30 NOTE — Assessment & Plan Note (Signed)
on Atorvastatin, LDL 67 11/28/21

## 2021-11-30 NOTE — Assessment & Plan Note (Signed)
resolved, Na 143 11/28/21

## 2021-11-30 NOTE — Assessment & Plan Note (Signed)
Bun/creat 26/1.12 11/28/21, may consider dc HCTZ, adding Jardiance 78m qd, update CMP/eGFR 6 months.

## 2021-11-30 NOTE — Assessment & Plan Note (Signed)
blood pressure is controlled, takes Atenolol, Enalapril, dc HCTZ, Bun/creat 26/1.12 11/28/21

## 2021-11-30 NOTE — Assessment & Plan Note (Signed)
stable, on Metformin '500mg'$  bid  desires no routine ophthalmology, podiatrist visit. Hgb a1c 7.6 11/28/21, may consider Jardiance, update Hgb a1c 6 months.

## 2021-11-30 NOTE — Progress Notes (Signed)
Location:   clinic [place]   Place of Service:  Clinic (12) Provider: Marlana Latus NP  Code Status: DNR Goals of Care: [place]    12/07/2020    3:13 PM  Advanced Directives  Does Patient Have a Medical Advance Directive? Yes  Type of Advance Directive [place]  Does patient want to make changes to medical advance directive? No - Patient declined  Copy of [place] in Chart? Yes - validated most recent copy scanned in chart (See row information)     Chief Complaint  Patient presents with   Medical Management of Chronic Issues    Patient is here for a 46M F/U for chronic conditions [facility] verified no shot record found   Quality Metric Gaps    Patient is due for covid vaccine Tdap vaccine  Diabetic foot exam    HPI: Patient is a 88 y.o. female seen today for medical management of chronic diseases.     HTN, blood pressure is controlled, takes Atenolol, Enalapril, off HCTZ, Bun/creat 26/1.12 11/28/21  CKD Bun/creat 26/1.12 11/28/21, started Jardiance             CAD, Hx of heart attack, takes Lipitor, Enalapril, ASA             Hyperlipidemia, on Atorvastatin, LDL 67 11/28/21             T2DM, stable, on Metformin 535m bid  desires no routine ophthalmology, podiatrist visit. Hgb a1c 7.6 11/28/21              Hyponatremia, resolved, Na 143 11/28/21  Past Medical History:  Diagnosis Date   Coronary arteriosclerosis    Diabetes mellitus without complication (HCC)    Type 2    Hyperlipidemia    Hypertension    Myocardial infarction ([facility]     Past Surgical History:  Procedure Laterality Date   APPENDECTOMY  1988   Dr. PCollier Salina  CORONARY ARTERY BYPASS GRAFT  1990   EYE SURGERY  2012   Dr, FVickki Muff  KYPHOPLASTY  2015   Dr. DDonivan Scull   Allergies  Allergen Reactions   Bactrim [Sulfamethoxazole-Trimethoprim] Diarrhea   Shrimp [Shellfish Allergy]    Poison Ivy Extract Rash    Allergies as of 11/30/2021       Reactions   Bactrim  [sulfamethoxazole-trimethoprim] Diarrhea   Shrimp [shellfish Allergy]    Poison Ivy Extract Rash        Medication List        Accurate as of November 30, 2021 11:59 PM. If you have any questions, ask your nurse or doctor.          STOP taking these medications    hydrochlorothiazide 25 MG tablet Commonly known as: HYDRODIURIL Stopped by: Verdun Rackley X Jesyca Weisenburger, NP       TAKE these medications    acetaminophen 500 MG tablet Commonly known as: TYLENOL Take 1 tablet (500 mg total) by mouth every 8 (eight) hours as needed for moderate pain.   aspirin EC 81 MG tablet Take 81 mg by mouth daily.   atenolol 50 MG tablet Commonly known as: TENORMIN TAKE ONE TABLET BY MOUTH DAILY.   atorvastatin 20 MG tablet Commonly known as: LIPITOR TAKE ONE TABLET BY MOUTH DAILY.   Calcium Carbonate-Vitamin D3 600-400 MG-UNIT Tabs Take 600 mg by mouth 2 (two) times daily with a meal.   empagliflozin 10 MG Tabs tablet Commonly known as: Jardiance Take 1 tablet (10 mg total)  by mouth daily before breakfast. Started by: Katrianna Friesenhahn X Advit Trethewey, NP   enalapril 20 MG tablet Commonly known as: VASOTEC TAKE ONE TABLET BY MOUTH TWICE DAILY   ibuprofen 200 MG tablet Commonly known as: ADVIL Take 200 mg by mouth 2 (two) times daily as needed for fever, headache or mild pain.   metFORMIN 500 MG tablet Commonly known as: GLUCOPHAGE TAKE ONE TABLET BY MOUTH TWICE DAILY.   triamcinolone cream 0.1 % Commonly known as: KENALOG APPLY TO AFFECTED AREA TWICE A DAY.        Review of Systems:  Review of Systems  Constitutional:  Negative for activity change, fatigue and fever.  HENT:  Positive for hearing loss. Negative for congestion and voice change.   Eyes:  Negative for visual disturbance.  Respiratory:  Positive for cough. Negative for shortness of breath and wheezing.        Occasional hacking cough for 30 more years since started ACEI  Cardiovascular:  Positive for leg swelling. Negative for chest  pain and palpitations.  Gastrointestinal:  Negative for abdominal pain and constipation.  Genitourinary:  Negative for dysuria and urgency.       Pessary, nocturnal urination x1 average  Musculoskeletal:  Positive for arthralgias, back pain and gait problem.  Skin:  Negative for color change.  Neurological:  Negative for speech difficulty, weakness and headaches.  Psychiatric/Behavioral:  Positive for sleep disturbance. Negative for behavioral problems. The patient is not nervous/anxious.        Sometimes stay awake after bathroom, but taking a nap during day.     Health Maintenance  Topic Date Due   TETANUS/TDAP  Never done   FOOT EXAM  01/28/2021   COVID-19 Vaccine (5 - Moderna series) 03/03/2021   Zoster Vaccines- Shingrix (1 of 2) 03/03/2024 (Originally 07/09/1973)   HEMOGLOBIN A1C  05/30/2022   Pneumonia Vaccine 50+ Years old  Completed   DEXA SCAN  Completed   HPV VACCINES  Aged Out   INFLUENZA VACCINE  Discontinued   OPHTHALMOLOGY EXAM  Discontinued    Physical Exam: Vitals:   11/30/21 1257  BP: (!) 140/80  Pulse: 61  Resp: 18  Temp: (!) 97.5 F (36.4 C)  SpO2: 98%  Weight: 139 lb (63 kg)  Height: 5' 5"  (1.651 m)   Body mass index is 23.13 kg/m. Physical Exam Vitals and nursing note reviewed.  Constitutional:      Appearance: Normal appearance.  HENT:     Head: Normocephalic and atraumatic.     Mouth/Throat:     Mouth: Mucous membranes are moist.  Eyes:     Extraocular Movements: Extraocular movements intact.     Conjunctiva/sclera: Conjunctivae normal.     Pupils: Pupils are equal, round, and reactive to light.  Cardiovascular:     Rate and Rhythm: Normal rate and regular rhythm.     Heart sounds: No murmur heard.    Comments: Right radial pulse not felt.  Pulmonary:     Breath sounds: No rhonchi or rales.  Abdominal:     General: Bowel sounds are normal.     Palpations: Abdomen is soft.     Tenderness: There is no abdominal tenderness.   Musculoskeletal:     Cervical back: Normal range of motion and neck supple.     Right lower leg: Edema present.     Left lower leg: Edema present.     Comments: Trace edema in ankles, hardly noticeable.   Skin:    General: Skin is warm  and dry.  Neurological:     General: No focal deficit present.     Mental Status: She is alert and oriented to person, place, and time. Mental status is at baseline.     Motor: No weakness.     Gait: Gait abnormal.     Comments: Resolved right arm/hand weakness, muscle strength 5/5  Psychiatric:        Mood and Affect: Mood normal.        Behavior: Behavior normal.        Thought Content: Thought content normal.        Judgment: Judgment normal.     Labs reviewed: Basic Metabolic Panel: Recent Labs    11/28/21 0730  NA 143  K 4.1  CL 102  CO2 32  GLUCOSE 166*  BUN 26*  CREATININE 1.12*  CALCIUM 9.8  TSH 3.60   Liver Function Tests: Recent Labs    11/28/21 0730  AST 13  ALT 9  BILITOT 0.6  PROT 6.7   No results for input(s): "LIPASE", "AMYLASE" in the last 8760 hours. No results for input(s): "AMMONIA" in the last 8760 hours. CBC: Recent Labs    12/07/20 1610 11/28/21 0730  WBC 8.8 8.3  NEUTROABS 6,107 5,627  HGB 15.1 13.7  HCT 44.7 40.8  MCV 92.5 92.9  PLT 226 222   Lipid Panel: Recent Labs    11/28/21 0730  CHOL 140  HDL 54  LDLCALC 67  TRIG 100  CHOLHDL 2.6   Lab Results  Component Value Date   HGBA1C 7.6 (H) 11/28/2021    Procedures since last visit: No results found.  Assessment/Plan  Type 2 diabetes mellitus (HCC)  stable, on Metformin 586m bid  desires no routine ophthalmology, podiatrist visit. Hgb a1c 7.6 11/28/21, may consider Jardiance, update Hgb a1c 6 months.   Hyponatremia resolved, Na 143 11/28/21  CKD (chronic kidney disease) stage 3, GFR 30-59 ml/min (HCC) Bun/creat 26/1.12 11/28/21, may consider dc HCTZ, adding Jardiance 110mqd, update CMP/eGFR 6 months.   HTN  (hypertension) blood pressure is controlled, takes Atenolol, Enalapril, dc HCTZ, Bun/creat 26/1.12 11/28/21  Hyperlipidemia  on Atorvastatin, LDL 67 11/28/21   Labs/tests ordered:  Hgb a1c, CMP/eGFR 6 months  Next appt:  6 months.

## 2021-12-01 ENCOUNTER — Encounter: Payer: Self-pay | Admitting: Nurse Practitioner

## 2021-12-17 ENCOUNTER — Other Ambulatory Visit: Payer: Self-pay | Admitting: Nurse Practitioner

## 2021-12-17 DIAGNOSIS — E785 Hyperlipidemia, unspecified: Secondary | ICD-10-CM

## 2021-12-17 DIAGNOSIS — I10 Essential (primary) hypertension: Secondary | ICD-10-CM

## 2021-12-17 DIAGNOSIS — I252 Old myocardial infarction: Secondary | ICD-10-CM

## 2021-12-17 DIAGNOSIS — E1100 Type 2 diabetes mellitus with hyperosmolarity without nonketotic hyperglycemic-hyperosmolar coma (NKHHC): Secondary | ICD-10-CM

## 2022-01-15 IMAGING — MR MR FOOT*R* W/O CM
4 of 5 series · 21 of 40 positions shown · non-contrast
Comparison: Radiographs 12/15/2020

CLINICAL DATA: Diabetic foot swelling, possible osteomyelitis.
Drainage of a lateral lesion 2 weeks ago.

EXAM:
MRI OF THE RIGHT FOREFOOT WITHOUT CONTRAST
TECHNIQUE: Multiplanar, multisequence MR imaging of the right forefoot was
performed. No intravenous contrast was administered.

[Series 4: T1 · coronal · 3.0mm · 0.22mm/px · 4 of 45 slices shown (1 of 2)]
[im 1/45]
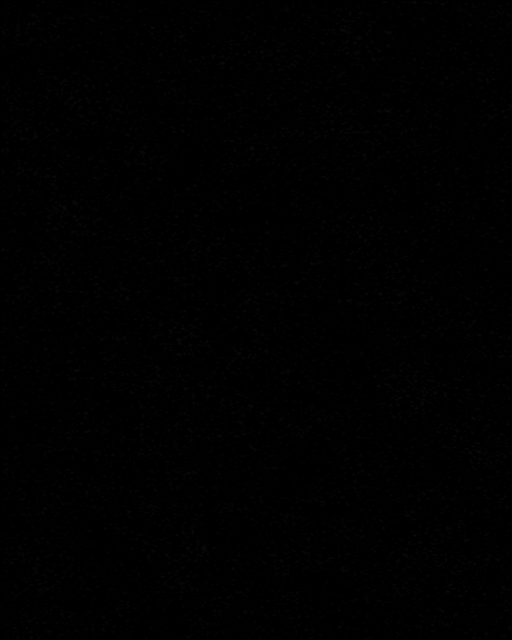
[im 9/45]
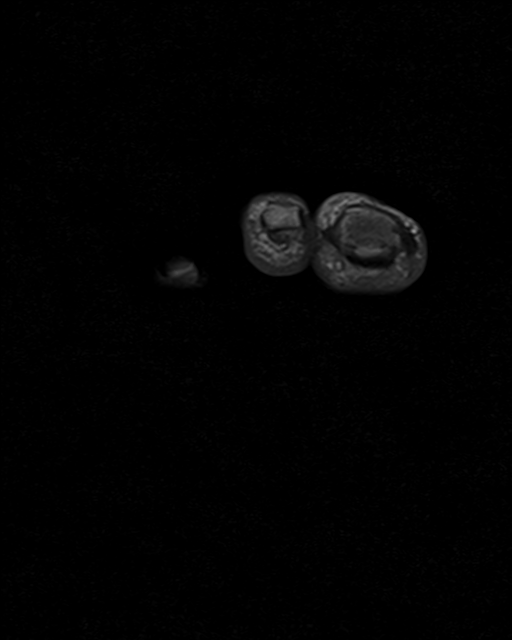
[im 25/45]
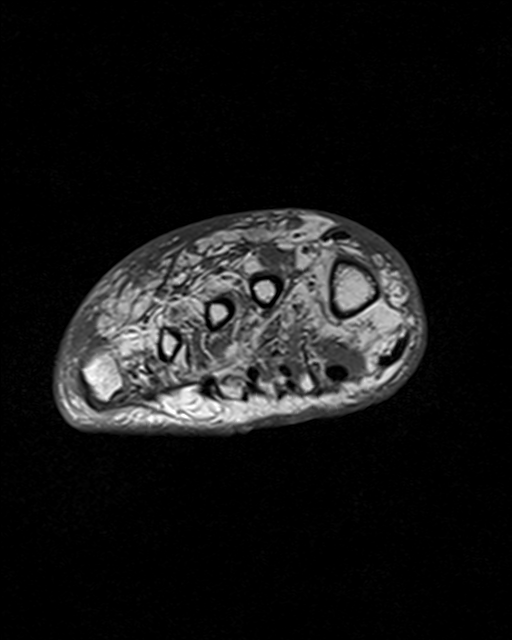
[im 41/45]
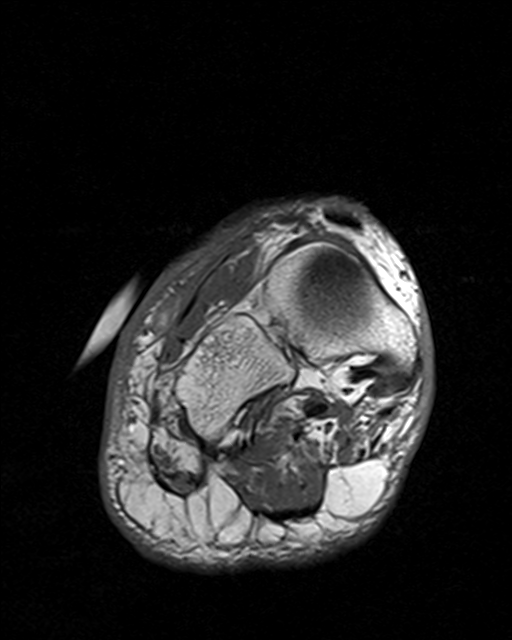

[Series 5: T2 fat-sat · coronal · 3.0mm · 0.27mm/px · 9 of 45 slices shown (1 of 2)]
[im 1/45]
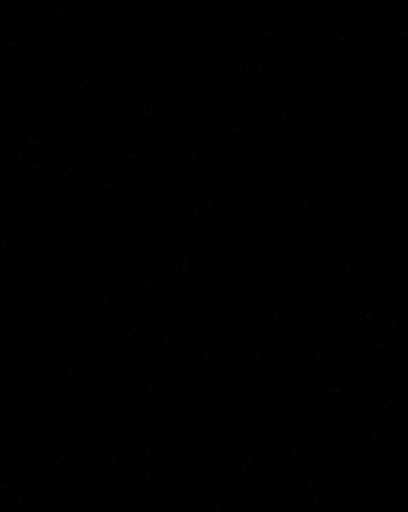
[im 9/45]
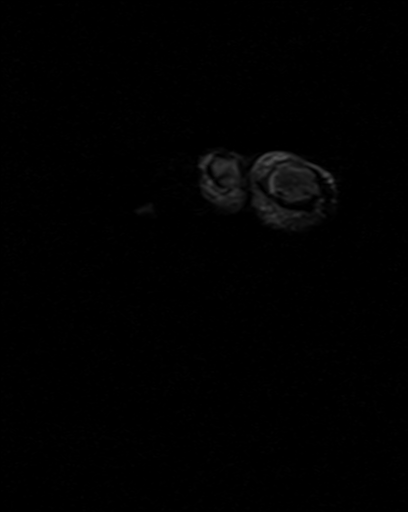
[im 13/45]
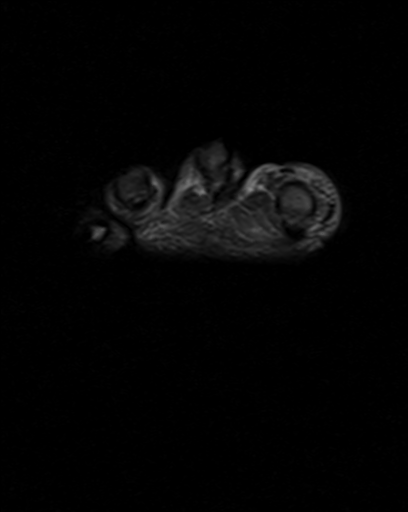
[im 21/45]
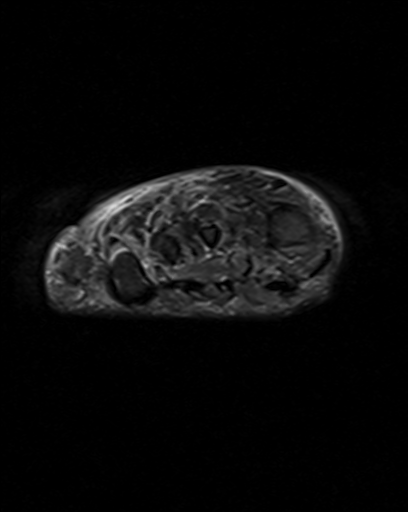
[im 25/45]
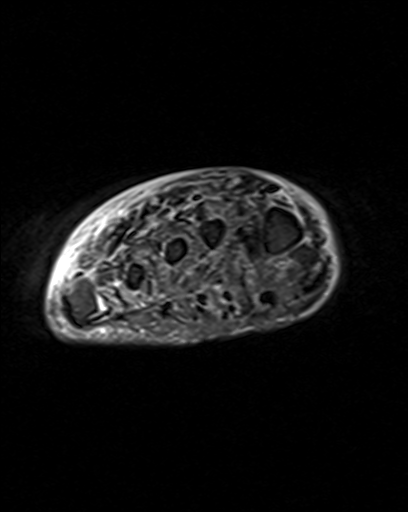
[im 33/45]
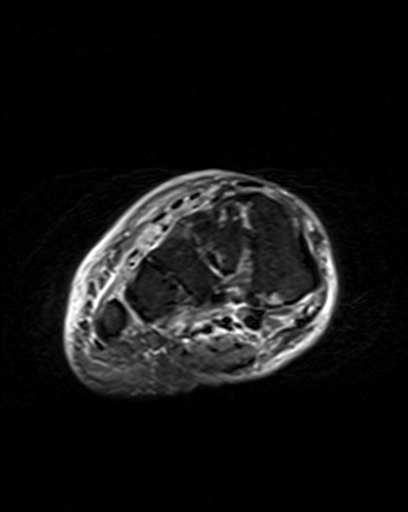
[im 37/45]
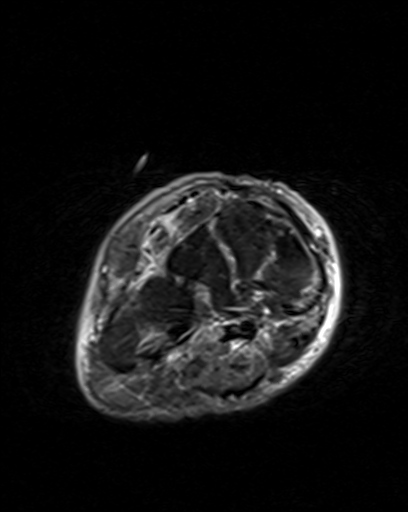
[im 41/45]
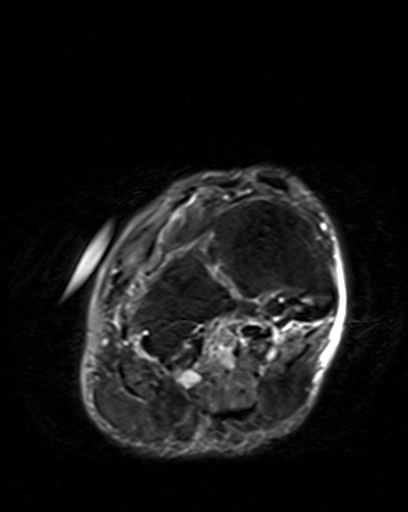
[im 45/45]
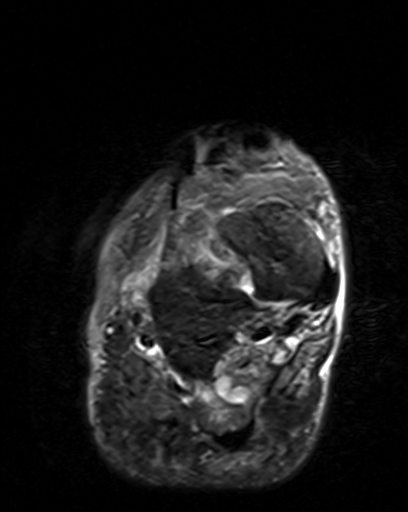

[Series 6: T2 fat-sat · axial · 3.0mm · 0.35mm/px · z∈[-98,-27]mm · 5 of 21 slices shown (2 of 2)]
[im 1/21]
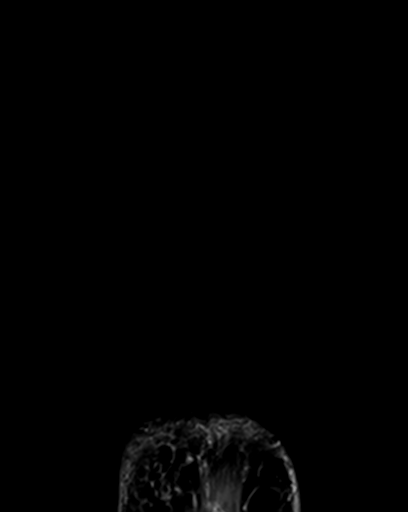
[im 6/21]
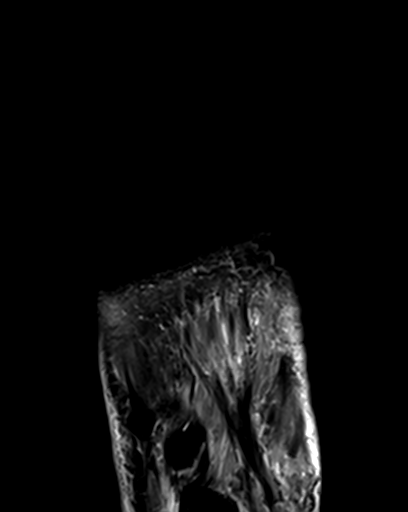
[im 11/21]
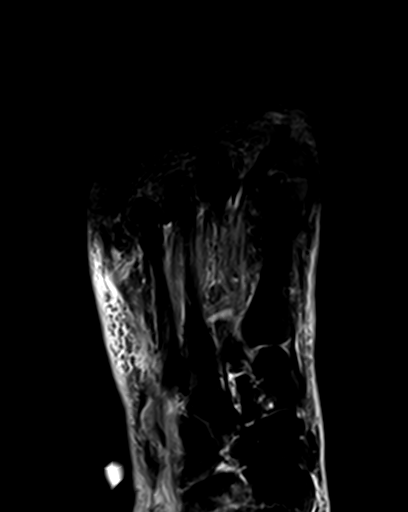
[im 16/21]
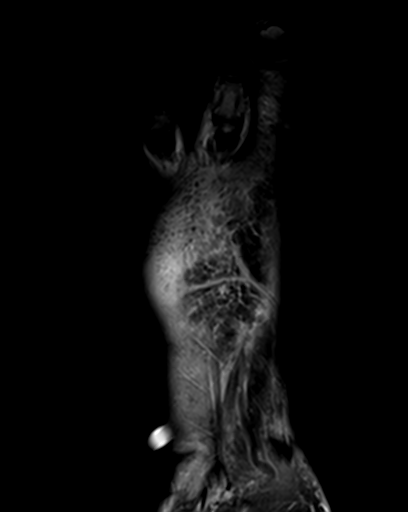
[im 21/21]
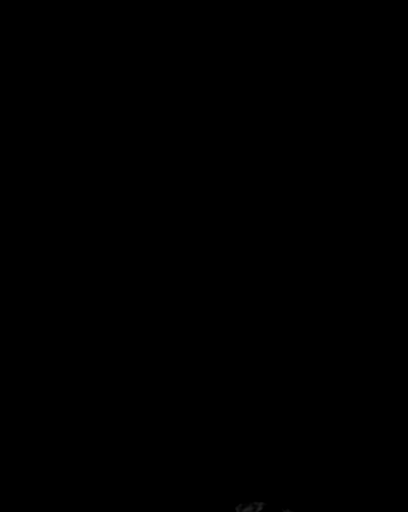

[Series 7: T1 · axial · 3.0mm · 0.35mm/px · z∈[-100,-28]mm · 3 of 21 slices shown (2 of 2)]
[im 1/21]
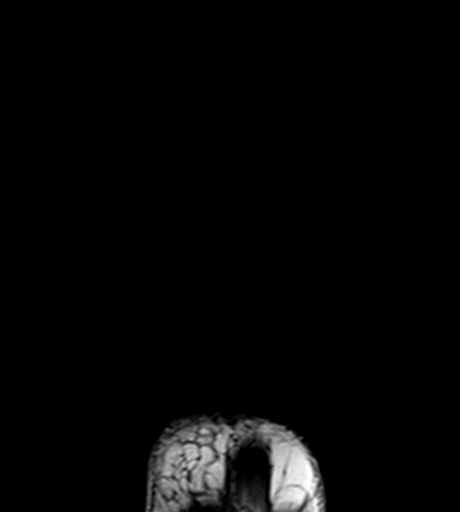
[im 11/21]
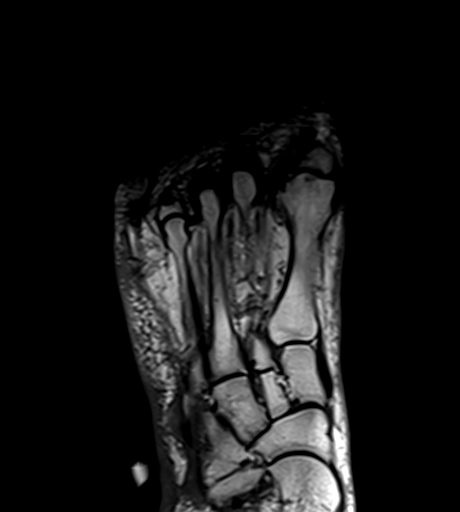
[im 21/21]
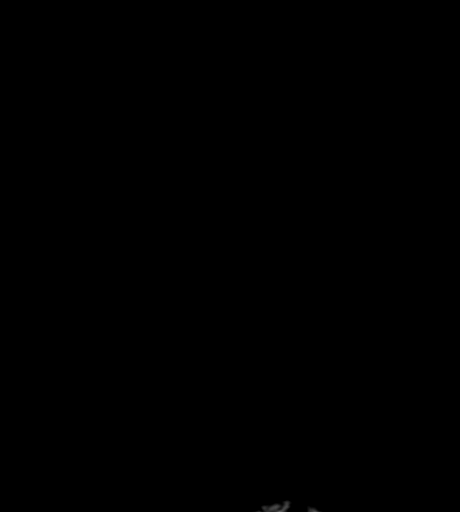

[21 of 40 positions shown; findings below may reference images not displayed]

FINDINGS: Bones/Joint/Cartilage

Degenerative arthropathy at the first MTP joint. Mild degenerative
findings in the midfoot. No osteomyelitis.

Ligaments

Lisfranc ligament appears intact.

Muscles and Tendons

Unremarkable

Soft tissues

In the region of concern and dorsal to the lateral portion of the
extensor digitorum brevis dorsal to the distal cuboid, we
demonstrate an indistinctly marginated 0.9 by 0.5 by 0.7 cm T1 and
T2 hypointense lesion with surrounding faintly accentuated T1 and
intermediate T2 signal (which itself measures about 1.7 by 0.9 by
2.0 cm). Enhancement characteristics are not assessed.

Dorsal and medial subcutaneous edema in the forefoot extending into
the dorsal toes.
IMPRESSION: 1. In the region of concern there is a mixed signal intensity
appearance with central low signal and peripheral intermediate
signal. This is not a characteristic appearance for abscess, and
could represent blood products/hematoma or possibly a local fibrotic
or granulomatous response. However, there is subcutaneous edema in
the dorsal foot extending into the toes, and accordingly surrounding
cellulitis is not excluded.
2. No osteomyelitis demonstrated.

## 2022-02-06 ENCOUNTER — Telehealth: Payer: Self-pay | Admitting: *Deleted

## 2022-02-06 NOTE — Telephone Encounter (Signed)
Mast, Man X, NP  You11 minutes ago (10:11 AM)   That is fine, thank you      Patient notified and agreed and Medication list updated.

## 2022-02-06 NOTE — Telephone Encounter (Signed)
Patient called and stated that she was seen 11/30/21 and was told to Discontinue HCTZ and start Jardiance.  Patient stated that ever since taking the Jardiance she has been weak and having to use a walker. Patient stated that she STOPPED the Jardiance on Christmas Eve and started back her HCTZ instead.   Please Advise.     11/30/2021 OV Note: CKD (chronic kidney disease) stage 3, GFR 30-59 ml/min (HCC) Bun/creat 26/1.12 11/28/21, Anique Beckley consider dc HCTZ, adding Jardiance 88m qd, update CMP/eGFR 6 months.

## 2022-02-13 ENCOUNTER — Other Ambulatory Visit: Payer: PPO

## 2022-02-15 ENCOUNTER — Encounter: Payer: PPO | Admitting: Nurse Practitioner

## 2022-02-28 ENCOUNTER — Other Ambulatory Visit: Payer: Self-pay

## 2022-02-28 DIAGNOSIS — E1100 Type 2 diabetes mellitus with hyperosmolarity without nonketotic hyperglycemic-hyperosmolar coma (NKHHC): Secondary | ICD-10-CM

## 2022-02-28 DIAGNOSIS — N1831 Chronic kidney disease, stage 3a: Secondary | ICD-10-CM

## 2022-03-16 ENCOUNTER — Other Ambulatory Visit: Payer: Self-pay | Admitting: Nurse Practitioner

## 2022-03-16 DIAGNOSIS — I10 Essential (primary) hypertension: Secondary | ICD-10-CM

## 2022-03-21 DIAGNOSIS — N8111 Cystocele, midline: Secondary | ICD-10-CM | POA: Diagnosis not present

## 2022-03-21 DIAGNOSIS — R3121 Asymptomatic microscopic hematuria: Secondary | ICD-10-CM | POA: Diagnosis not present

## 2022-03-21 DIAGNOSIS — R300 Dysuria: Secondary | ICD-10-CM | POA: Diagnosis not present

## 2022-05-15 ENCOUNTER — Other Ambulatory Visit: Payer: PPO

## 2022-05-15 DIAGNOSIS — E1100 Type 2 diabetes mellitus with hyperosmolarity without nonketotic hyperglycemic-hyperosmolar coma (NKHHC): Secondary | ICD-10-CM | POA: Diagnosis not present

## 2022-05-15 DIAGNOSIS — N1831 Chronic kidney disease, stage 3a: Secondary | ICD-10-CM | POA: Diagnosis not present

## 2022-05-15 LAB — LIPID PANEL

## 2022-05-16 LAB — COMPLETE METABOLIC PANEL WITH GFR
AG Ratio: 1.6 (calc) (ref 1.0–2.5)
ALT: 10 U/L (ref 6–29)
AST: 13 U/L (ref 10–35)
Albumin: 4 g/dL (ref 3.6–5.1)
Alkaline phosphatase (APISO): 71 U/L (ref 37–153)
BUN/Creatinine Ratio: 21 (calc) (ref 6–22)
BUN: 21 mg/dL (ref 7–25)
CO2: 29 mmol/L (ref 20–32)
Calcium: 9.9 mg/dL (ref 8.6–10.4)
Chloride: 101 mmol/L (ref 98–110)
Creat: 0.99 mg/dL — ABNORMAL HIGH (ref 0.60–0.95)
Globulin: 2.5 g/dL (calc) (ref 1.9–3.7)
Glucose, Bld: 146 mg/dL — ABNORMAL HIGH (ref 65–99)
Potassium: 3.9 mmol/L (ref 3.5–5.3)
Sodium: 139 mmol/L (ref 135–146)
Total Bilirubin: 0.6 mg/dL (ref 0.2–1.2)
Total Protein: 6.5 g/dL (ref 6.1–8.1)
eGFR: 52 mL/min/{1.73_m2} — ABNORMAL LOW (ref >=60)

## 2022-05-16 LAB — HEMOGLOBIN A1C
Hgb A1c MFr Bld: 7.8 % of total Hgb — ABNORMAL HIGH (ref <5.7)
Mean Plasma Glucose: 177 mg/dL
eAG (mmol/L): 9.8 mmol/L

## 2022-05-16 NOTE — Progress Notes (Signed)
This encounter was created in error - please disregard.

## 2022-05-17 ENCOUNTER — Encounter: Payer: Self-pay | Admitting: Nurse Practitioner

## 2022-05-17 ENCOUNTER — Non-Acute Institutional Stay: Payer: PPO | Admitting: Nurse Practitioner

## 2022-05-17 ENCOUNTER — Encounter: Payer: PPO | Admitting: Nurse Practitioner

## 2022-05-17 VITALS — BP 128/78 | HR 69 | Temp 97.0°F | Resp 18 | Ht 65.0 in | Wt 135.5 lb

## 2022-05-17 DIAGNOSIS — E1100 Type 2 diabetes mellitus with hyperosmolarity without nonketotic hyperglycemic-hyperosmolar coma (NKHHC): Secondary | ICD-10-CM

## 2022-05-17 DIAGNOSIS — E782 Mixed hyperlipidemia: Secondary | ICD-10-CM

## 2022-05-17 DIAGNOSIS — I10 Essential (primary) hypertension: Secondary | ICD-10-CM | POA: Diagnosis not present

## 2022-05-17 DIAGNOSIS — N1831 Chronic kidney disease, stage 3a: Secondary | ICD-10-CM | POA: Diagnosis not present

## 2022-05-17 DIAGNOSIS — I2510 Atherosclerotic heart disease of native coronary artery without angina pectoris: Secondary | ICD-10-CM

## 2022-05-17 MED ORDER — METFORMIN HCL 850 MG PO TABS: 850.0000 mg | ORAL_TABLET | Freq: Two times a day (BID) | ORAL | 2 refills | Status: DC

## 2022-05-17 NOTE — Assessment & Plan Note (Addendum)
blood pressure is controlled, takes Atenolol, Enalapril, HCTZ

## 2022-05-17 NOTE — Assessment & Plan Note (Signed)
Stable, Hx of heart attack, takes Lipitor, Enalapril, ASA

## 2022-05-17 NOTE — Progress Notes (Signed)
Location:   Clinic [facility]   Place of Service:  Clinic (12) Provider: Marlana Latus NP  Code Status: DNR Goals of Care:     12/07/2020    3:13 PM  Advanced Directives  Does Patient Have a Medical Advance Directive? Yes  Type of Advance Directive [place]  Does patient want to make changes to medical advance directive? No - Patient declined  Copy of [place] in Chart? Yes - validated most recent copy scanned in chart (See row information)     Chief Complaint  Patient presents with   Follow-up    Six Month Follow up    HPI: Patient is a 89 y.o. female seen today for medical management of chronic diseases.      HTN, blood pressure is controlled, takes Atenolol, Enalapril, resumed HCTZ after stopped Jardiance.              CKD Bun/creat 21/0.99 05/15/22, off Jardiance 12/23 caused weakness.              CAD, Hx of heart attack, takes Lipitor, Enalapril, ASA             Hyperlipidemia, on Atorvastatin, LDL 67 11/28/21             T2DM, on Metformin,  desires no routine ophthalmology, podiatrist visit. Hgb a1c 7.8 05/15/22  Pessary, 06/16/22 GYN, saw Urology in the past                 Past Medical History:  Diagnosis Date   Coronary arteriosclerosis    Diabetes mellitus without complication    Type 2    Hyperlipidemia    Hypertension    Myocardial infarction     Past Surgical History:  Procedure Laterality Date   APPENDECTOMY  1988   Dr. Collier Salina   CORONARY ARTERY BYPASS GRAFT  1990   EYE SURGERY  2012   Dr, Vickki Muff   KYPHOPLASTY  2015   Dr. Donivan Scull    Allergies  Allergen Reactions   Bactrim [Sulfamethoxazole-Trimethoprim] Diarrhea   Shrimp [Shellfish Allergy]    Poison Ivy Extract Rash    Allergies as of 05/17/2022       Reactions   Bactrim [sulfamethoxazole-trimethoprim] Diarrhea   Shrimp [shellfish Allergy]    Poison Ivy Extract Rash        Medication List        Accurate as of May 17, 2022  4:08 PM. If you have any  questions, ask your nurse or doctor.          acetaminophen 500 MG tablet Commonly known as: TYLENOL Take 1 tablet (500 mg total) by mouth every 8 (eight) hours as needed for moderate pain.   aspirin EC 81 MG tablet Take 81 mg by mouth daily.   atenolol 50 MG tablet Commonly known as: TENORMIN TAKE ONE TABLET BY MOUTH DAILY   atorvastatin 20 MG tablet Commonly known as: LIPITOR TAKE ONE TABLET BY MOUTH DAILY   Calcium Carbonate-Vitamin D3 600-400 MG-UNIT Tabs Take 600 mg by mouth 2 (two) times daily with a meal.   enalapril 20 MG tablet Commonly known as: VASOTEC TAKE ONE TABLET BY MOUTH TWICE DAILY   hydrochlorothiazide 25 MG tablet Commonly known as: HYDRODIURIL TAKE ONE TABLET BY MOUTH DAILY   ibuprofen 200 MG tablet Commonly known as: ADVIL Take 200 mg by mouth 2 (two) times daily as needed for fever, headache or mild pain.   metFORMIN 850 MG tablet Commonly  known as: GLUCOPHAGE Take 1 tablet (850 mg total) by mouth 2 (two) times daily with a meal. What changed:  medication strength how much to take when to take this Changed by: Deronda Christian X Edd Reppert, NP   triamcinolone cream 0.1 % Commonly known as: KENALOG APPLY TO AFFECTED AREA TWICE A DAY.        Review of Systems:  Review of Systems  Constitutional:  Negative for activity change, fatigue and fever.  HENT:  Positive for hearing loss. Negative for congestion and voice change.   Eyes:  Negative for visual disturbance.  Respiratory:  Negative for cough, shortness of breath and wheezing.        Occasional hacking cough for 30 more years since started ACEI  Cardiovascular:  Positive for leg swelling. Negative for chest pain and palpitations.  Gastrointestinal:  Negative for abdominal pain and constipation.  Genitourinary:  Negative for dysuria and urgency.       Pessary, nocturnal urination x1 average  Musculoskeletal:  Positive for arthralgias, back pain and gait problem.  Skin:  Negative for color change.   Neurological:  Negative for speech difficulty, weakness and headaches.  Psychiatric/Behavioral:  Positive for sleep disturbance. Negative for behavioral problems. The patient is not nervous/anxious.        Sometimes stay awake after bathroom, but taking a nap during day.     Health Maintenance  Topic Date Due   DTaP/Tdap/Td (1 - Tdap) Never done   COVID-19 Vaccine (5 - 2023-24 season) 10/13/2021   Medicare Annual Wellness (AWV)  10/20/2021   Zoster Vaccines- Shingrix (1 of 2) 03/03/2024 (Originally 07/09/1973)   HEMOGLOBIN A1C  11/14/2022   FOOT EXAM  05/17/2023   Pneumonia Vaccine 52+ Years old  Completed   DEXA SCAN  Completed   HPV VACCINES  Aged Out   INFLUENZA VACCINE  Discontinued   OPHTHALMOLOGY EXAM  Discontinued    Physical Exam: Vitals:   05/17/22 1305  BP: 128/78  Pulse: 69  Resp: 18  Temp: (!) 97 F (36.1 C)  SpO2: 98%  Weight: 135 lb 8 oz (61.5 kg)  Height: 5\' 5"  (1.651 m)   Body mass index is 22.55 kg/m. Physical Exam Vitals and nursing note reviewed.  Constitutional:      Appearance: Normal appearance.  HENT:     Head: Normocephalic and atraumatic.     Mouth/Throat:     Mouth: Mucous membranes are moist.  Eyes:     Extraocular Movements: Extraocular movements intact.     Conjunctiva/sclera: Conjunctivae normal.     Pupils: Pupils are equal, round, and reactive to light.  Cardiovascular:     Rate and Rhythm: Normal rate and regular rhythm.     Heart sounds: No murmur heard.    Comments: Right radial pulse not felt.  Pulmonary:     Breath sounds: No rhonchi or rales.  Abdominal:     General: Bowel sounds are normal.     Palpations: Abdomen is soft.     Tenderness: There is no abdominal tenderness.  Musculoskeletal:     Cervical back: Normal range of motion and neck supple.     Right lower leg: Edema present.     Left lower leg: Edema present.     Comments: Trace edema in ankles  Skin:    General: Skin is warm and dry.  Neurological:      General: No focal deficit present.     Mental Status: She is alert and oriented to person, place, and time.  Mental status is at baseline.     Motor: No weakness.     Gait: Gait abnormal.     Comments: Resolved right arm/hand weakness, muscle strength 5/5, ambulates with walker.   Psychiatric:        Mood and Affect: Mood normal.        Behavior: Behavior normal.        Thought Content: Thought content normal.        Judgment: Judgment normal.     Labs reviewed: Basic Metabolic Panel: Recent Labs    11/28/21 0730 05/15/22 0713  NA 143 139  K 4.1 3.9  CL 102 101  CO2 32 29  GLUCOSE 166* 146*  BUN 26* 21  CREATININE 1.12* 0.99*  CALCIUM 9.8 9.9  TSH 3.60  --    Liver Function Tests: Recent Labs    11/28/21 0730 05/15/22 0713  AST 13 13  ALT 9 10  BILITOT 0.6 0.6  PROT 6.7 6.5   No results for input(s): "LIPASE", "AMYLASE" in the last 8760 hours. No results for input(s): "AMMONIA" in the last 8760 hours. CBC: Recent Labs    11/28/21 0730  WBC 8.3  NEUTROABS 5,627  HGB 13.7  HCT 40.8  MCV 92.9  PLT 222   Lipid Panel: Recent Labs    11/28/21 0730  CHOL 140  HDL 54  LDLCALC 67  TRIG 100  CHOLHDL 2.6   Lab Results  Component Value Date   HGBA1C 7.8 (H) 05/15/2022    Procedures since last visit: No results found.  Assessment/Plan  Type 2 diabetes mellitus (HCC) Increase Metformin 875mg  bid  desires no routine ophthalmology, podiatrist visit. Hgb a1c 7.8 05/15/22 trended up gradually, repeat Hgb A1c 6 months.   Hyperlipidemia on Atorvastatin, LDL 67 11/28/21, repeat lipid panel 6 months.   CAD (coronary artery disease) Stable, Hx of heart attack, takes Lipitor, Enalapril, ASA  CKD (chronic kidney disease) stage 3, GFR 30-59 ml/min Bun/creat 21/0.99 05/15/22, better, off Jardiance  HTN (hypertension) blood pressure is controlled, takes Atenolol, Enalapril, HCTZ   Labs/tests ordered:  none  Next appt:  AWV 2 weeks, f/u in clinic [facility] 6 months

## 2022-05-17 NOTE — Assessment & Plan Note (Addendum)
Bun/creat 21/0.99 05/15/22, better, off Jardiance

## 2022-05-17 NOTE — Assessment & Plan Note (Signed)
on Atorvastatin, LDL 67 11/28/21, repeat lipid panel 6 months.

## 2022-05-17 NOTE — Assessment & Plan Note (Addendum)
Increase Metformin 875mg  bid  desires no routine ophthalmology, podiatrist visit. Hgb a1c 7.8 05/15/22 trended up gradually, repeat Hgb A1c 6 months.

## 2022-05-18 ENCOUNTER — Telehealth: Payer: Self-pay

## 2022-05-18 NOTE — Telephone Encounter (Signed)
Patient states she picked up increased dose of metformin. After reading the warnings she is now "scared to death" to take it. She states that she will just continue taking the lower dose for now. She doesn't think that at her age she should risk taking the medications with all the warnings.  Message sent to Man Nedra Hai, NP

## 2022-06-07 ENCOUNTER — Non-Acute Institutional Stay (INDEPENDENT_AMBULATORY_CARE_PROVIDER_SITE_OTHER): Payer: PPO | Admitting: Nurse Practitioner

## 2022-06-07 ENCOUNTER — Encounter: Payer: Self-pay | Admitting: Nurse Practitioner

## 2022-06-07 VITALS — BP 124/76 | HR 68 | Temp 97.5°F | Ht 65.0 in | Wt 133.4 lb

## 2022-06-07 DIAGNOSIS — E1100 Type 2 diabetes mellitus with hyperosmolarity without nonketotic hyperglycemic-hyperosmolar coma (NKHHC): Secondary | ICD-10-CM | POA: Diagnosis not present

## 2022-06-07 DIAGNOSIS — Z0000 Encounter for general adult medical examination without abnormal findings: Secondary | ICD-10-CM | POA: Diagnosis not present

## 2022-06-07 NOTE — Progress Notes (Signed)
Subjective:   Jessica Walters is a 89 y.o. female who presents for Medicare Annual (Subsequent) preventive examination in clinic [facility].  Cardiac Risk Factors include: advanced age (>88men, >60 women);diabetes mellitus;hypertension     Objective:    Today's Vitals   06/07/22 1309  BP: 124/76  Pulse: 68  Temp: (!) 97.5 F (36.4 C)  SpO2: 100%  Weight: 133 lb 6.4 oz (60.5 kg)  Height:  (1.651 m)   Body mass index is 22.2 kg/m.     06/07/2022    1:05 PM 12/07/2020    3:13 PM 12/01/2020   10:49 AM 10/20/2020    1:19 PM 09/15/2020    1:13 PM 09/11/2020    8:27 AM 05/26/2020    1:49 PM  Advanced Directives  Does Patient Have a Medical Advance Directive? Yes Yes Yes Yes Yes Yes Yes  Type of Estate agent of [place];Living will Healthcare Power of [employer] Power of [employer] Power of [employer] Power of Attorney Living will;Healthcare Power of Attorney Healthcare Power of Attorney  Does patient want to make changes to medical advance directive? No - Patient declined No - Patient declined No - Patient declined No - Patient declined No - Patient declined  No - Patient declined  Copy of Healthcare Power of Attorney in Chart? Yes - validated most recent copy scanned in chart (See row information) Yes - validated most recent copy scanned in chart (See row information) Yes - validated most recent copy scanned in chart (See row information) Yes - validated most recent copy scanned in chart (See row information) Yes - validated most recent copy scanned in chart (See row information)  Yes - validated most recent copy scanned in chart (See row information)    Current Medications (verified) Outpatient Encounter Medications as of 06/07/2022  Medication Sig   atenolol (TENORMIN) 50 MG tablet TAKE ONE TABLET BY MOUTH DAILY   atorvastatin (LIPITOR) 20 MG tablet TAKE ONE TABLET BY MOUTH DAILY   Calcium Carbonate-Vitamin D3  600-400 MG-UNIT TABS Take 600 mg by mouth 2 (two) times daily with a meal.   enalapril (VASOTEC) 20 MG tablet TAKE ONE TABLET BY MOUTH TWICE DAILY   hydrochlorothiazide (HYDRODIURIL) 25 MG tablet TAKE ONE TABLET BY MOUTH DAILY   metFORMIN (GLUCOPHAGE) 850 MG tablet Take 1 tablet (850 mg total) by mouth 2 (two) times daily with a meal.   triamcinolone cream (KENALOG) 0.1 % APPLY TO AFFECTED AREA TWICE A DAY.   aspirin EC 81 MG tablet Take 81 mg by mouth daily. (Patient not taking: Reported on 06/07/2022)   [DISCONTINUED] acetaminophen (TYLENOL) 500 MG tablet Take 1 tablet (500 mg total) by mouth every 8 (eight) hours as needed for moderate pain.   [DISCONTINUED] ibuprofen (ADVIL) 200 MG tablet Take 200 mg by mouth 2 (two) times daily as needed for fever, headache or mild pain.   No facility-administered encounter medications on file as of 06/07/2022.    Allergies (verified) Bactrim [sulfamethoxazole-trimethoprim], Shrimp [shellfish allergy], and Poison ivy extract   History: Past Medical History:  Diagnosis Date   Coronary arteriosclerosis    Diabetes mellitus without complication    Type 2    Hyperlipidemia    Hypertension    Myocardial infarction    Past Surgical History:  Procedure Laterality Date   APPENDECTOMY  1988   Dr. Theron Arista   CORONARY ARTERY BYPASS GRAFT  1990   EYE SURGERY  2012   Dr, Ether Griffins  KYPHOPLASTY  2015   Dr. Loralie Champagne   Family History  Problem Relation Age of Onset   Hypertension Mother    Lung cancer Father    Social History   Socioeconomic History   Marital status: Widowed    Spouse name: Not on file   Number of children: Not on file   Years of education: Not on file   Highest education level: Not on file  Occupational History   Not on file  Tobacco Use   Smoking status: Never   Smokeless tobacco: Never  Substance and Sexual Activity   Alcohol use: Yes    Comment: 2-3 weekly   Drug use: Not on file   Sexual activity: Not on file  Other Topics  Concern   Not on file  Social History Narrative   Not on file   Social Determinants of Health   Financial Resource Strain: Low Risk  (01/10/2017)   Overall Financial Resource Strain (CARDIA)    Difficulty of Paying Living Expenses: Not hard at all  Food Insecurity: No Food Insecurity (01/10/2017)   Hunger Vital Sign    Worried About Running Out of Food in the Last Year: Never true    Ran Out of Food in the Last Year: Never true  Transportation Needs: No Transportation Needs (01/10/2017)   PRAPARE - Administrator, Civil Service (Medical): No    Lack of Transportation (Non-Medical): No  Physical Activity: Insufficiently Active (01/10/2017)   Exercise Vital Sign    Days of Exercise per Week: 3 days    Minutes of Exercise per Session: 30 min  Stress: No Stress Concern Present (01/10/2017)   [employer] of Occupational Health - Occupational Stress Questionnaire    Feeling of Stress : Not at all  Social Connections: Moderately Isolated (01/10/2017)   Social Connection and Isolation Panel [NHANES]    Frequency of Communication with Friends and Family: Once a week    Frequency of Social Gatherings with Friends and Family: More than three times a week    Attends Religious Services: Never    Database administrator or Organizations: No    Attends Banker Meetings: Never    Marital Status: Widowed    Tobacco Counseling Counseling given: Not Answered   Clinical Intake:  Pre-visit preparation completed: Yes  Pain : No/denies pain     BMI - recorded: 22.55 Nutritional Status: BMI of 19-24  Normal Nutritional Risks: None Diabetes: Yes CBG done?: No Did pt. bring in CBG monitor from home?: No  How often do you need to have someone help you when you read instructions, pamphlets, or other written materials from your doctor or pharmacy?: 1 - Never What is the last grade level you completed in school?: College  Diabetic?yes  Interpreter Needed?:  No  Information entered by :: Porsha McClurkin,CMA   Activities of Daily Living    06/07/2022    1:07 PM  In your present state of health, do you have any difficulty performing the following activities:  Hearing? 0  Vision? 0  Difficulty concentrating or making decisions? 0  Walking or climbing stairs? 0  Dressing or bathing? 0  Doing errands, shopping? 0  Preparing Food and eating ? N  Using the Toilet? N  In the past six months, have you accidently leaked urine? Y  Do you have problems with loss of bowel control? N  Managing your Medications? N  Managing your Finances? N  Housekeeping or managing your Housekeeping? N  Patient Care Team: Kipling Graser X, NP as PCP - General (Internal Medicine) Yesennia Hirota X, NP as Nurse Practitioner (Internal Medicine)  Indicate any recent Medical Services you may have received from other than [facility] providers in the past year (date may be approximate).     Assessment:   This is a routine wellness examination for [place].  Hearing/Vision screen Hearing Screening - Comments:: No hearing concerns Vision Screening - Comments:: No vision concerns  Dietary issues and exercise activities discussed: Current Exercise Habits: Home exercise routine, Type of exercise: strength training/weights;walking, Time (Minutes): 30, Frequency (Times/Week): 7, Weekly Exercise (Minutes/Week): 210, Intensity: Mild, Exercise limited by: None identified   Goals Addressed             This Visit's Progress    Maintain Mobility and Function       Evidence-based guidance:  Acknowledge and validate impact of pain, loss of strength and potential disfigurement (hand osteoarthritis) on mental health and daily life, such as social isolation, anxiety, depression, impaired sexual relationship and   injury from falls.  Anticipate referral to physical or occupational therapy for assessment, therapeutic exercise and recommendation for adaptive equipment or assistive devices;  encourage participation.  Assess impact on ability to perform activities of daily living, as well as engage in sports and leisure events or requirements of work or school.  Provide anticipatory guidance and reassurance about the benefit of exercise to maintain function; acknowledge and normalize fear that exercise may worsen symptoms.  Encourage regular exercise, at least 10 minutes at a time for 45 minutes per week; consider yoga, water exercise and proprioceptive exercises; encourage use of wearable activity tracker to increase motivation and adherence.  Encourage maintenance or resumption of daily activities, including employment, as pain allows and with minimal exposure to trauma.  Assist patient to advocate for adaptations to the work environment.  Consider level of pain and function, gender, age, lifestyle, patient preference, quality of life, readiness and ?ocapacity to benefit? when recommending patients for orthopaedic surgery consultation.  Explore strategies, such as changes to medication regimen or activity that enables patient to anticipate and manage flare-ups that increase deconditioning and disability.  Explore patient preferences; encourage exposure to a broader range of activities that have been avoided for fear of experiencing pain.  Identify barriers to participation in therapy or exercise, such as pain with activity, anticipated or imagined pain.  Monitor postoperative joint replacement or any preexisting joint replacement for ongoing pain and loss of function; provide social support and encouragement throughout recovery.   Notes:        Depression Screen    06/07/2022    1:07 PM 05/17/2022    1:05 PM 10/20/2020    1:32 PM 08/20/2019    2:41 PM 01/13/2018    8:39 AM 01/10/2017   10:55 AM  PHQ 2/9 Scores  PHQ - 2 Score 0 0 0 0 0 0  PHQ- 9 Score    2      Fall Risk    06/07/2022    1:06 PM 05/17/2022    1:05 PM 12/07/2020    3:13 PM 12/01/2020   10:49 AM 10/20/2020     1:19 PM  Fall Risk   Falls in the past year? 0 0 0 0 0  Number falls in past yr: 0 0 0 0 0  Injury with Fall? 0 0 0 0 0  Risk for fall due to : No Fall Risks No Fall Risks No Fall Risks  No Fall Risks  Follow up Falls evaluation completed Falls evaluation completed  Falls evaluation completed Falls evaluation completed    FALL RISK PREVENTION PERTAINING TO THE HOME:  Any stairs in or around the home? Yes  If so, are there any without handrails? No  Home free of loose throw rugs in walkways, pet beds, electrical cords, etc? Yes  Adequate lighting in your home to reduce risk of falls? Yes   ASSISTIVE DEVICES UTILIZED TO PREVENT FALLS:  Life alert? No  Use of a cane, walker or w/c? Yes  Grab bars in the bathroom? Yes  Shower chair or bench in shower? Yes  Elevated toilet seat or a handicapped toilet? Yes   TIMED UP AND GO:  Was the test performed? Yes .  Length of time to ambulate 10 feet: 10 sec.   Gait steady and fast with assistive device  Cognitive Function:    06/07/2022    1:14 PM 10/20/2020    1:32 PM 08/20/2019    2:48 PM 01/10/2017   11:04 AM  MMSE - Mini Mental State Exam  Orientation to time Orientation to Place Registration Attention/ Calculation Recall Language- name 2 objects Language- repeat Language- follow 3 step command Language- read & follow direction Write a sentence Copy design Total score Immunizations Immunization History  Administered Date(s) Administered   Fluad Quad(high Dose 65+) 11/28/2020   Influenza, High Dose Seasonal PF 11/27/2016, 11/14/2017, 11/25/2019   Influenza, Seasonal, Injecte, Preservative Fre 12/27/2010, 12/21/2011   Influenza,inj,Quad PF,6+ Mos 01/14/2013, 12/01/2013, 01/25/2015   Influenza-Unspecified 11/12/2017   Moderna Sars-Covid-2 Vaccination 06/08/2019, 07/06/2019, 01/15/2020   Pfizer Covid-19  Vaccine Bivalent Booster 42yrs & up 11/01/2020   Pneumococcal Conjugate-13 07/27/2014   Pneumococcal Polysaccharide-23 11/07/2004    TDAP status: Due, Education has been provided regarding the importance of this vaccine. Advised may receive this vaccine at local pharmacy or Health Dept. Aware to provide a copy of the vaccination record if obtained from local pharmacy or Health Dept. Verbalized acceptance and understanding.  Flu Vaccine status: Up to date  Pneumococcal vaccine status: Up to date  Covid-19 vaccine status: Completed vaccines  Qualifies for Shingles Vaccine? Yes   Zostavax completed Yes   Shingrix Completed?: Yes  Screening Tests Health Maintenance  Topic Date Due   COVID-19 Vaccine (5 - 2023-24 season) 10/13/2021   Zoster Vaccines- Shingrix (1 of 2) 03/03/2024 (Originally 07/09/1973)   HEMOGLOBIN A1C  11/14/2022   FOOT EXAM  05/17/2023   Medicare Annual Wellness (AWV)  06/07/2023   Pneumonia Vaccine 61+ Years old  Completed   DEXA SCAN  Completed   HPV VACCINES  Aged Out   DTaP/Tdap/Td  Discontinued   INFLUENZA VACCINE  Discontinued   OPHTHALMOLOGY EXAM  Discontinued    Health Maintenance  Health Maintenance Due  Topic Date Due   COVID-19 Vaccine (5 - 2023-24 season) 10/13/2021    Colorectal cancer screening: No longer required.   Mammogram status: No longer required due to aged out.  Bone Density status: Completed 02/13/1995. Results reflect: Bone density results: NORMAL. Repeat every never per pt's decision years.  Lung Cancer Screening: (Low Dose CT Chest recommended  if Age 33-80 years, 30 pack-year currently smoking OR have quit w/in 15years.) does not qualify.     Additional Screening:  Hepatitis C Screening: does not qualify;   Vision Screening: Recommended annual ophthalmology exams for early detection of glaucoma and other disorders of the eye. Is the patient up to date with their annual eye exam?  No patient's preference Who is the provider  or what is the name of the office in which the patient attends annual eye exams? Patient will provide if desires.  If pt is not established with a provider, would they like to be referred to a provider to establish care? No .   Dental Screening: Recommended annual dental exams for proper oral hygiene  Community Resource Referral / Chronic Care Management: CRR required this visit?  No   CCM required this visit?  No      Plan:     I have personally reviewed and noted the following in the patient's chart:   Medical and social history Use of alcohol, tobacco or illicit drugs  Current medications and supplements including opioid prescriptions. Patient is not currently taking opioid prescriptions. Functional ability and status Nutritional status Physical activity Advanced directives List of other physicians Hospitalizations, surgeries, and ER visits in previous 12 months Vitals Screenings to include cognitive, depression, and falls Referrals and appointments  In addition, I have reviewed and discussed with patient certain preventive protocols, quality metrics, and best practice recommendations. A written personalized care plan for preventive services as well as general preventive health recommendations were provided to patient.     Zykeria Laguardia X Fantasy Donald, NP   06/07/2022

## 2022-06-08 NOTE — Progress Notes (Signed)
GYNECOLOGY  VISIT   HPI: 89 y.o.   Widowed  Caucasian  female   No obstetric history on file. with No LMP recorded. Patient is postmenopausal.   here for   NEW GYN for pessary. Pt has been removing pessary herself for 5 years but has no longer had an exam in over 7 years. Pt last took it out in January but has noticed sliding recently.  Daughter in law present for the discussion portion of the visit today.  Using pessary for about 20 years.  Has not changed the pessary.   Lost 20 pounds and feels the pessary slipping occasionally.  She is able to retain the pessary with voiding and bowel movements.   No vaginal discharge, pain or bleeding.   Not using vaginal estrogen cream.   Last came out in January, 2024, and patient was able to replace it.  She now has decreased hand strength and would like assistance for managing this.  She is comfortable with a pessary check every 6 months, which is what she did previously.  Living at [facility] for 7 years.   GYNECOLOGIC HISTORY: No LMP recorded. Patient is postmenopausal. Contraception:  PMP Menopausal hormone therapy:  n/a Last mammogram:  stopped doing them 10 years ago Last pap smear:   unsure, 7+ years ago        OB History   No obstetric history on file.        Patient Active Problem List   Diagnosis Date Noted   CKD (chronic kidney disease) stage 3, GFR 30-59 ml/min (HCC) 11/28/2021   Hyponatremia 09/15/2020   Thoracic back pain 09/15/2020   CAD (coronary artery disease) 09/15/2020   Right arm weakness 09/15/2020   Allergic dermatitis 03/27/2018   Compression fracture of L2 (HCC) 02/23/2016   Hyperlipidemia 02/23/2016   Type 2 diabetes mellitus (HCC) 11/17/2015   HTN (hypertension) 11/17/2015   History of heart attack 11/17/2015   Cystocele, unspecified (CODE) 11/17/2015    Past Medical History:  Diagnosis Date   Coronary arteriosclerosis    Diabetes mellitus without complication (HCC)    Type 2     Hyperlipidemia    Hypertension    Myocardial infarction [facility]     Past Surgical History:  Procedure Laterality Date   APPENDECTOMY  1988   Dr. Theron Arista   CORONARY ARTERY BYPASS GRAFT  1990   EYE SURGERY  2012   Dr, Ether Griffins   KYPHOPLASTY  2015   Dr. Loralie Champagne    Current Outpatient Medications  Medication Sig Dispense Refill   atenolol (TENORMIN) 50 MG tablet TAKE ONE TABLET BY MOUTH DAILY 90 tablet 2   atorvastatin (LIPITOR) 20 MG tablet TAKE ONE TABLET BY MOUTH DAILY 90 tablet 2   Calcium Carbonate-Vitamin D3 600-400 MG-UNIT TABS Take 600 mg by mouth 2 (two) times daily with a meal. 180 tablet 2   enalapril (VASOTEC) 20 MG tablet TAKE ONE TABLET BY MOUTH TWICE DAILY 180 tablet 2   hydrochlorothiazide (HYDRODIURIL) 25 MG tablet TAKE ONE TABLET BY MOUTH DAILY 90 tablet 1   metFORMIN (GLUCOPHAGE) 850 MG tablet Take 1 tablet (850 mg total) by mouth 2 (two) times daily with a meal. 180 tablet 2   triamcinolone cream (KENALOG) 0.1 % APPLY TO AFFECTED AREA TWICE A DAY. (Patient not taking: Reported on 06/21/2022) 30 g 3   No current facility-administered medications for this visit.     ALLERGIES: Bactrim [sulfamethoxazole-trimethoprim], Shrimp [shellfish allergy], and Poison ivy extract  Family History  Problem  Relation Age of Onset   Hypertension Mother    Lung cancer Father     Social History   Socioeconomic History   Marital status: Widowed    Spouse name: Not on file   Number of children: Not on file   Years of education: Not on file   Highest education level: Not on file  Occupational History   Not on file  Tobacco Use   Smoking status: Never   Smokeless tobacco: Never  Substance and Sexual Activity   Alcohol use: Yes    Comment: 2-3 weekly   Drug use: Not on file   Sexual activity: Not on file  Other Topics Concern   Not on file  Social History Narrative   Not on file   Social Determinants of Health   Financial Resource Strain: Low Risk  (01/10/2017)   Overall  Financial Resource Strain (CARDIA)    Difficulty of Paying Living Expenses: Not hard at all  Food Insecurity: No Food Insecurity (01/10/2017)   Hunger Vital Sign    Worried About Running Out of Food in the Last Year: Never true    Ran Out of Food in the Last Year: Never true  Transportation Needs: No Transportation Needs (01/10/2017)   PRAPARE - Administrator, Civil Service (Medical): No    Lack of Transportation (Non-Medical): No  Physical Activity: Insufficiently Active (01/10/2017)   Exercise Vital Sign    Days of Exercise per Week: 3 days    Minutes of Exercise per Session: 30 min  Stress: No Stress Concern Present (01/10/2017)   [employer] of Occupational Health - Occupational Stress Questionnaire    Feeling of Stress : Not at all  Social Connections: Moderately Isolated (01/10/2017)   Social Connection and Isolation Panel [NHANES]    Frequency of Communication with Friends and Family: Once a week    Frequency of Social Gatherings with Friends and Family: More than three times a week    Attends Religious Services: Never    Database administrator or Organizations: No    Attends Banker Meetings: Never    Marital Status: Widowed  Intimate Partner Violence: Not At Risk (01/10/2017)   Humiliation, Afraid, Rape, and Kick questionnaire    Fear of Current or Ex-Partner: No    Emotionally Abused: No    Physically Abused: No    Sexually Abused: No    Review of Systems  All other systems reviewed and are negative.   PHYSICAL EXAMINATION:    BP 124/80 (BP Location: Left Arm, Patient Position: Sitting, Cuff Size: Normal)   Pulse 76   Wt 133 lb (60.3 kg)   SpO2 98%   BMI 22.13 kg/m     General appearance: alert, cooperative and appears stated age  Pelvic: External genitalia:  no lesions              Urethra:  normal appearing urethra with no masses, tenderness or lesions.  Urethral mucosal prolapse.              Bartholins and Skenes: normal                  Vagina: normal appearing vagina with normal color and discharge, no lesions              Cervix: no lesions                Bimanual Exam:  Uterus:  normal size, contour, position, consistency, mobility, non-tender.  First degree cystocele and first degree rectocele.               Adnexa: no mass, fullness, tenderness              Rectal exam: yes.  Confirms.              Anus:  normal sphincter tone, no lesions  Ring with support pessary removed, cleansed and replaced.   Chaperone was present for exam:  Clydie Braun, CMA  ASSESSMENT  Pelvic organ prolapse.  Pessary maintenance. Tolerating pessary well.   PLAN  Continue pessary use.  Fu for pessary check in 6 months and prn.

## 2022-06-21 ENCOUNTER — Encounter: Payer: Self-pay | Admitting: Obstetrics and Gynecology

## 2022-06-21 ENCOUNTER — Ambulatory Visit (INDEPENDENT_AMBULATORY_CARE_PROVIDER_SITE_OTHER): Payer: PPO | Admitting: Obstetrics and Gynecology

## 2022-06-21 VITALS — BP 124/80 | HR 76 | Wt 133.0 lb

## 2022-06-21 DIAGNOSIS — N819 Female genital prolapse, unspecified: Secondary | ICD-10-CM | POA: Diagnosis not present

## 2022-06-21 DIAGNOSIS — Z4689 Encounter for fitting and adjustment of other specified devices: Secondary | ICD-10-CM | POA: Diagnosis not present

## 2022-09-08 ENCOUNTER — Other Ambulatory Visit: Payer: Self-pay | Admitting: Nurse Practitioner

## 2022-09-08 DIAGNOSIS — L239 Allergic contact dermatitis, unspecified cause: Secondary | ICD-10-CM

## 2022-09-08 DIAGNOSIS — E785 Hyperlipidemia, unspecified: Secondary | ICD-10-CM

## 2022-09-08 DIAGNOSIS — I10 Essential (primary) hypertension: Secondary | ICD-10-CM

## 2022-09-08 DIAGNOSIS — I252 Old myocardial infarction: Secondary | ICD-10-CM

## 2022-09-17 ENCOUNTER — Telehealth: Payer: Self-pay | Admitting: *Deleted

## 2022-09-17 ENCOUNTER — Other Ambulatory Visit: Payer: Self-pay | Admitting: Nurse Practitioner

## 2022-09-17 DIAGNOSIS — U071 COVID-19: Secondary | ICD-10-CM

## 2022-09-17 MED ORDER — NIRMATRELVIR/RITONAVIR (PAXLOVID) TABLET (RENAL DOSING)
2.0000 | ORAL_TABLET | Freq: Two times a day (BID) | ORAL | 0 refills | Status: AC
Start: 2022-09-17 — End: 2022-09-22

## 2022-09-17 NOTE — Telephone Encounter (Signed)
Patient called and stated that she tested POSITIVE this morning for Covid.  Patient is Hoarse and has Cold Symptoms. No other symptoms noted. No Fever.   Requesting medication to be sent to pharmacy.   Please Advise. No available appointments.

## 2022-09-17 NOTE — Telephone Encounter (Signed)
Mast, Man X, NP  You10 minutes ago (3:14 PM)   Paxlovid sent, please make an appt Thursday in the clinic [facility]  Thank you      Patient Notified and agreed.  Appointment scheduled for Thursday with [facility].

## 2022-09-19 ENCOUNTER — Encounter: Payer: Self-pay | Admitting: Nurse Practitioner

## 2022-09-20 ENCOUNTER — Non-Acute Institutional Stay: Payer: PPO | Admitting: Nurse Practitioner

## 2022-09-20 ENCOUNTER — Encounter: Payer: Self-pay | Admitting: Nurse Practitioner

## 2022-09-20 VITALS — BP 124/74 | HR 78 | Temp 97.5°F | Ht 65.0 in | Wt 129.0 lb

## 2022-09-20 DIAGNOSIS — I2510 Atherosclerotic heart disease of native coronary artery without angina pectoris: Secondary | ICD-10-CM | POA: Diagnosis not present

## 2022-09-20 DIAGNOSIS — I10 Essential (primary) hypertension: Secondary | ICD-10-CM

## 2022-09-20 DIAGNOSIS — E782 Mixed hyperlipidemia: Secondary | ICD-10-CM | POA: Diagnosis not present

## 2022-09-20 DIAGNOSIS — E1100 Type 2 diabetes mellitus with hyperosmolarity without nonketotic hyperglycemic-hyperosmolar coma (NKHHC): Secondary | ICD-10-CM | POA: Diagnosis not present

## 2022-09-20 DIAGNOSIS — N1831 Chronic kidney disease, stage 3a: Secondary | ICD-10-CM

## 2022-09-20 DIAGNOSIS — U071 COVID-19: Secondary | ICD-10-CM

## 2022-09-20 NOTE — Progress Notes (Signed)
Location:   Clinic [facility]   Place of Service:  Clinic (12) Provider: Chipper Oman NP  Code Status: DNR Goals of Care:     09/19/2022   10:05 AM  Advanced Directives  Does Patient Have a Medical Advance Directive? Yes  Type of Advance Directive Healthcare Power of Attorney  Does patient want to make changes to medical advance directive? No - Patient declined  Copy of Healthcare Power of Attorney in Chart? Yes - validated most recent copy scanned in chart (See row information)     Chief Complaint  Patient presents with   Acute Visit    Covid follow-up. Patient is feeling better, other than no appetite. Patient never received paxlovid.     HPI: Patient is a 89 y.o. female seen today for an acute visit for tested positive COVID, presentation: cold symptoms 09/17/22, never taking Paxlovid, feeling better today hoarseness, headache,  nasal congestion, and disorientated, except generalized weakness and poor appetite. No cough, SOB, or chest pain.       HTN, blood pressure is controlled, takes Atenolol, Enalapril, resumed HCTZ after stopped Jardiance.              CKD Bun/creat 21/0.99 05/15/22, off Jardiance 12/23 caused weakness.              CAD, Hx of heart attack, takes Lipitor, Enalapril, ASA             Hyperlipidemia, on Atorvastatin, LDL 67 11/28/21             T2DM, on Metformin,  desires no routine ophthalmology, podiatrist visit. Hgb a1c 7.8 05/15/22             Pessary, 06/16/22 GYN, saw Urology in the past               Past Medical History:  Diagnosis Date   Coronary arteriosclerosis    Diabetes mellitus without complication (HCC)    Type 2    Hyperlipidemia    Hypertension    Myocardial infarction [facility])     Past Surgical History:  Procedure Laterality Date   APPENDECTOMY  1988   Dr. Theron Arista   CORONARY ARTERY BYPASS GRAFT  1990   EYE SURGERY  2012   Dr, Ether Griffins   KYPHOPLASTY  2015   Dr. Loralie Champagne    Allergies  Allergen Reactions   Bactrim  [Sulfamethoxazole-Trimethoprim] Diarrhea   Shrimp [Shellfish Allergy]    Poison Ivy Extract Rash    Allergies as of 09/20/2022       Reactions   Bactrim [sulfamethoxazole-trimethoprim] Diarrhea   Shrimp [shellfish Allergy]    Poison Ivy Extract Rash        Medication List        Accurate as of September 20, 2022 11:59 PM. If you have any questions, ask your nurse or doctor.          atenolol 50 MG tablet Commonly known as: TENORMIN TAKE ONE TABLET BY MOUTH DAILY   atorvastatin 20 MG tablet Commonly known as: LIPITOR TAKE ONE TABLET BY MOUTH DAILY   Calcium Carbonate-Vitamin D3 600-400 MG-UNIT Tabs Take 600 mg by mouth 2 (two) times daily with a meal.   enalapril 20 MG tablet Commonly known as: VASOTEC TAKE ONE TABLET BY MOUTH TWICE DAILY   hydrochlorothiazide 25 MG tablet Commonly known as: HYDRODIURIL TAKE ONE TABLET BY MOUTH DAILY   metFORMIN 850 MG tablet Commonly known as: GLUCOPHAGE Take 1 tablet (850 mg total) by mouth 2 (two) times  daily with a meal.   nirmatrelvir/ritonavir (renal dosing) 10 x 150 MG & 10 x 100MG  Tabs Commonly known as: PAXLOVID Take 2 tablets by mouth 2 (two) times daily for 5 days. Patient GFR is 54 Take nirmatrelvir (150 mg) one tablet twice daily for 5 days and ritonavir (100 mg) one tablet twice daily for 5 days.   triamcinolone cream 0.1 % Commonly known as: KENALOG APPLY TO AFFECTED AREA TWICE A DAY.        Review of Systems:  Review of Systems  Constitutional:  Negative for activity change, fatigue and fever.  HENT:  Positive for hearing loss. Negative for congestion and voice change.   Eyes:  Negative for visual disturbance.  Respiratory:  Negative for cough, shortness of breath and wheezing.        Occasional hacking cough for 30 more years since started ACEI  Cardiovascular:  Positive for leg swelling. Negative for chest pain and palpitations.  Gastrointestinal:  Negative for abdominal pain and constipation.   Genitourinary:  Negative for dysuria and urgency.       Pessary, nocturnal urination x1 average  Musculoskeletal:  Positive for arthralgias, back pain and gait problem.  Skin:  Negative for color change.  Neurological:  Negative for speech difficulty, weakness and headaches.  Psychiatric/Behavioral:  Positive for sleep disturbance. Negative for behavioral problems. The patient is not nervous/anxious.        Sometimes stay awake after bathroom, but taking a nap during day.     Health Maintenance  Topic Date Due   COVID-19 Vaccine (5 - 2023-24 season) 10/13/2021   Zoster Vaccines- Shingrix (1 of 2) 03/03/2024 (Originally 07/09/1973)   HEMOGLOBIN A1C  11/14/2022   FOOT EXAM  05/17/2023   Medicare Annual Wellness (AWV)  06/07/2023   Pneumonia Vaccine 28+ Years old  Completed   DEXA SCAN  Completed   HPV VACCINES  Aged Out   DTaP/Tdap/Td  Discontinued   INFLUENZA VACCINE  Discontinued   OPHTHALMOLOGY EXAM  Discontinued    Physical Exam: Vitals:   09/19/22 1003  BP: 124/74  Pulse: 78  Temp: (!) 97.5 F (36.4 C)  TempSrc: Temporal  SpO2: 95%  Weight: 129 lb (58.5 kg)  Height: 5\' 5"  (1.651 m)   Body mass index is 21.47 kg/m. Physical Exam Vitals and nursing note reviewed.  Constitutional:      Appearance: Normal appearance.  HENT:     Head: Normocephalic and atraumatic.     Mouth/Throat:     Mouth: Mucous membranes are moist.     Pharynx: Posterior oropharyngeal erythema present.     Comments: Left side, denied pain Eyes:     Extraocular Movements: Extraocular movements intact.     Conjunctiva/sclera: Conjunctivae normal.     Pupils: Pupils are equal, round, and reactive to light.  Cardiovascular:     Rate and Rhythm: Normal rate and regular rhythm.     Heart sounds: No murmur heard.    Comments: Right radial pulse not felt.  Pulmonary:     Breath sounds: No rhonchi or rales.  Abdominal:     General: Bowel sounds are normal.     Palpations: Abdomen is soft.      Tenderness: There is no abdominal tenderness.  Musculoskeletal:     Cervical back: Normal range of motion and neck supple.     Right lower leg: Edema present.     Left lower leg: Edema present.     Comments: Trace edema in ankles  Skin:  General: Skin is warm and dry.  Neurological:     General: No focal deficit present.     Mental Status: She is alert and oriented to person, place, and time. Mental status is at baseline.     Motor: No weakness.     Gait: Gait abnormal.     Comments: Resolved right arm/hand weakness, muscle strength 5/5, ambulates with walker.   Psychiatric:        Mood and Affect: Mood normal.        Behavior: Behavior normal.        Thought Content: Thought content normal.        Judgment: Judgment normal.     Labs reviewed: Basic Metabolic Panel: Recent Labs    11/28/21 0730 05/15/22 0713  NA 143 139  K 4.1 3.9  CL 102 101  CO2 32 29  GLUCOSE 166* 146*  BUN 26* 21  CREATININE 1.12* 0.99*  CALCIUM 9.8 9.9  TSH 3.60  --    Liver Function Tests: Recent Labs    11/28/21 0730 05/15/22 0713  AST 13 13  ALT 9 10  BILITOT 0.6 0.6  PROT 6.7 6.5   No results for input(s): "LIPASE", "AMYLASE" in the last 8760 hours. No results for input(s): "AMMONIA" in the last 8760 hours. CBC: Recent Labs    11/28/21 0730  WBC 8.3  NEUTROABS 5,627  HGB 13.7  HCT 40.8  MCV 92.9  PLT 222   Lipid Panel: Recent Labs    11/28/21 0730  CHOL 140  HDL 54  LDLCALC 67  TRIG 100  CHOLHDL 2.6   Lab Results  Component Value Date   HGBA1C 7.8 (H) 05/15/2022    Procedures since last visit: No results found.  Assessment/Plan COVID-19 virus infection tested positive COVID, presentation: cold symptoms 09/17/22, never taking Paxlovid, feeling better today hoarseness, headache,  nasal congestion, and disorientated, except generalized weakness and poor appetite. No cough, SOB, or chest pain, declined labs.   HTN (hypertension)  blood pressure is controlled,  takes Atenolol, Enalapril, resumed HCTZ after stopped Jardiance.   CKD (chronic kidney disease) stage 3, GFR 30-59 ml/min (HCC) Bun/creat 21/0.99 05/15/22, off Jardiance 12/23 caused weakness.   CAD (coronary artery disease) Hx of heart attack, takes Lipitor, Enalapril, ASA  Hyperlipidemia on Atorvastatin, LDL 67 11/28/21  Type 2 diabetes mellitus (HCC)  on Metformin,  desires no routine ophthalmology, podiatrist visit. Hgb a1c 7.8 05/15/22  Cystocele, unspecified (CODE)  06/16/22 GYN, saw Urology in the past    Labs/tests ordered:  none  Next appt:  12/11/2022

## 2022-09-20 NOTE — Assessment & Plan Note (Signed)
blood pressure is controlled, takes Atenolol, Enalapril, resumed HCTZ after stopped Jardiance.

## 2022-09-20 NOTE — Assessment & Plan Note (Signed)
on Atorvastatin, LDL 67 11/28/21

## 2022-09-20 NOTE — Assessment & Plan Note (Signed)
Hx of heart attack, takes Lipitor, Enalapril, ASA

## 2022-09-20 NOTE — Assessment & Plan Note (Signed)
06/16/22 GYN, saw Urology in the past

## 2022-09-20 NOTE — Assessment & Plan Note (Signed)
Bun/creat 21/0.99 05/15/22, off Jardiance 12/23 caused weakness.

## 2022-09-20 NOTE — Assessment & Plan Note (Signed)
on Metformin,  desires no routine ophthalmology, podiatrist visit. Hgb a1c 7.8 05/15/22

## 2022-09-20 NOTE — Assessment & Plan Note (Addendum)
tested positive COVID, presentation: cold symptoms 09/17/22, never taking Paxlovid, feeling better today hoarseness, headache,  nasal congestion, and disorientated, except generalized weakness and poor appetite. No cough, SOB, or chest pain, declined labs.

## 2022-09-21 ENCOUNTER — Encounter: Payer: Self-pay | Admitting: Nurse Practitioner

## 2022-12-10 NOTE — Progress Notes (Unsigned)
GYNECOLOGY  VISIT   HPI: 89 y.o.   Widowed  Caucasian  female   No obstetric history on file. with No LMP recorded. Patient is postmenopausal.   here for   pessary check.  She is using a ring with support.  Her daughter in law is present today for the visit.   Denies discharge, bleeding, or pain.   Not taking her pessary out.   It is holding her prolapse well.   Leaks once in a while.  Occasional leak with straining to have a BM.  Can leak when she is getting to the toilet.  Can have to strain to void sometimes.  Managing ok overall. Wears a pad.  Bowel function is good.  Had some diarrhea when her Metformin dose was increased.  Her dosage is now back down to 500 mg.  Lost 27 pounds without effort.  Her appetite is decreased.  Her A1C is down.    Lives at [facility].   GYNECOLOGIC HISTORY: No LMP recorded. Patient is postmenopausal. Contraception:  PMP Menopausal hormone therapy:  n/a Last mammogram:  stopped doing them 10 years ago Last pap smear:   unsure, 7+ years ago        OB History   No obstetric history on file.        Patient Active Problem List   Diagnosis Date Noted   Weight loss 12/20/2022   COVID-19 virus infection 09/20/2022   CKD (chronic kidney disease) stage 3, GFR 30-59 ml/min (HCC) 11/28/2021   Hyponatremia 09/15/2020   Thoracic back pain 09/15/2020   CAD (coronary artery disease) 09/15/2020   Right arm weakness 09/15/2020   Allergic dermatitis 03/27/2018   Compression fracture of L2 (HCC) 02/23/2016   Hyperlipidemia 02/23/2016   Type 2 diabetes mellitus (HCC) 11/17/2015   HTN (hypertension) 11/17/2015   History of heart attack 11/17/2015   Cystocele, unspecified (CODE) 11/17/2015    Past Medical History:  Diagnosis Date   Coronary arteriosclerosis    Diabetes mellitus without complication (HCC)    Type 2    Hyperlipidemia    Hypertension    Myocardial infarction [facility])     Past Surgical History:  Procedure Laterality Date    APPENDECTOMY  1988   Dr. Theron Arista   CORONARY ARTERY BYPASS GRAFT  1990   EYE SURGERY  2012   Dr, Ether Griffins   KYPHOPLASTY  2015   Dr. Loralie Champagne    Current Outpatient Medications  Medication Sig Dispense Refill   atenolol (TENORMIN) 50 MG tablet TAKE ONE TABLET BY MOUTH DAILY 90 tablet 1   atorvastatin (LIPITOR) 20 MG tablet TAKE ONE TABLET BY MOUTH DAILY 90 tablet 1   Calcium Carbonate-Vitamin D3 600-400 MG-UNIT TABS Take 600 mg by mouth 2 (two) times daily with a meal. 180 tablet 2   enalapril (VASOTEC) 20 MG tablet TAKE ONE TABLET BY MOUTH TWICE DAILY 180 tablet 1   hydrochlorothiazide (HYDRODIURIL) 25 MG tablet TAKE ONE TABLET BY MOUTH DAILY 90 tablet 1   metFORMIN (GLUCOPHAGE) 500 MG tablet Take 1 tablet (500 mg total) by mouth 2 (two) times daily with a meal. 180 tablet 3   triamcinolone cream (KENALOG) 0.1 % APPLY TO AFFECTED AREA TWICE A DAY. 30 g 11   No current facility-administered medications for this visit.     ALLERGIES: Bactrim [sulfamethoxazole-trimethoprim], Shrimp [shellfish allergy], and Poison ivy extract  Family History  Problem Relation Age of Onset   Hypertension Mother    Lung cancer Father  Social History   Socioeconomic History   Marital status: Widowed    Spouse name: Not on file   Number of children: Not on file   Years of education: Not on file   Highest education level: Not on file  Occupational History   Not on file  Tobacco Use   Smoking status: Never   Smokeless tobacco: Never  Vaping Use   Vaping status: Never Used  Substance and Sexual Activity   Alcohol use: Not Currently   Drug use: Never   Sexual activity: Not on file  Other Topics Concern   Not on file  Social History Narrative   Not on file   Social Determinants of Health   Financial Resource Strain: Low Risk  (01/10/2017)   Overall Financial Resource Strain (CARDIA)    Difficulty of Paying Living Expenses: Not hard at all  Food Insecurity: No Food Insecurity (01/10/2017)    Hunger Vital Sign    Worried About Running Out of Food in the Last Year: Never true    Ran Out of Food in the Last Year: Never true  Transportation Needs: No Transportation Needs (01/10/2017)   PRAPARE - Administrator, Civil Service (Medical): No    Lack of Transportation (Non-Medical): No  Physical Activity: Insufficiently Active (01/10/2017)   Exercise Vital Sign    Days of Exercise per Week: 3 days    Minutes of Exercise per Session: 30 min  Stress: No Stress Concern Present (01/10/2017)   [employer] of Occupational Health - Occupational Stress Questionnaire    Feeling of Stress : Not at all  Social Connections: Moderately Isolated (01/10/2017)   Social Connection and Isolation Panel [NHANES]    Frequency of Communication with Friends and Family: Once a week    Frequency of Social Gatherings with Friends and Family: More than three times a week    Attends Religious Services: Never    Database administrator or Organizations: No    Attends Banker Meetings: Never    Marital Status: Widowed  Intimate Partner Violence: Not At Risk (01/10/2017)   Humiliation, Afraid, Rape, and Kick questionnaire    Fear of Current or Ex-Partner: No    Emotionally Abused: No    Physically Abused: No    Sexually Abused: No    Review of Systems  All other systems reviewed and are negative.   PHYSICAL EXAMINATION:    BP 128/74 (BP Location: Left Arm, Patient Position: Sitting, Cuff Size: Normal)   Pulse 74   Ht 5\' 5"  (1.651 m)   Wt 127 lb (57.6 kg)   SpO2 98%   BMI 21.13 kg/m     General appearance: alert, cooperative and appears stated age   Pelvic: External genitalia:  no lesions              Urethra:  normal appearing urethra with no masses, tenderness or lesions              Bartholins and Skenes: normal                 Vagina: normal appearing vagina with normal color and discharge, no lesions              Cervix: no lesions                Bimanual  Exam:  Uterus:  normal size, contour, position, consistency, mobility, non-tender  Adnexa: no mass, fullness, tenderness       Pessary removed, cleansed, and replaced.   Chaperone was present for exam:  Warren Lacy, CMA  ASSESSMENT  Pelvic organ prolapse.  Pessary maintenance.  Doing well.  PLAN  Continue pessary use.  Follow up in 6 months and prn.     22 min  total time was spent for this patient encounter, including preparation, face-to-face counseling with the patient, coordination of care, and documentation of the encounter.

## 2022-12-11 ENCOUNTER — Other Ambulatory Visit: Payer: PPO

## 2022-12-11 DIAGNOSIS — N1831 Chronic kidney disease, stage 3a: Secondary | ICD-10-CM | POA: Diagnosis not present

## 2022-12-11 DIAGNOSIS — E782 Mixed hyperlipidemia: Secondary | ICD-10-CM | POA: Diagnosis not present

## 2022-12-11 DIAGNOSIS — E1100 Type 2 diabetes mellitus with hyperosmolarity without nonketotic hyperglycemic-hyperosmolar coma (NKHHC): Secondary | ICD-10-CM | POA: Diagnosis not present

## 2022-12-11 LAB — CBC WITH DIFFERENTIAL/PLATELET
Absolute Lymphocytes: 2080 {cells}/uL (ref 850–3900)
Absolute Monocytes: 660 {cells}/uL (ref 200–950)
Basophils Absolute: 50 {cells}/uL (ref 0–200)
Basophils Relative: 0.5 %
Eosinophils Absolute: 240 {cells}/uL (ref 15–500)
Eosinophils Relative: 2.4 %
HCT: 41.3 % (ref 35.0–45.0)
Hemoglobin: 13.3 g/dL (ref 11.7–15.5)
MCH: 30.5 pg (ref 27.0–33.0)
MCHC: 32.2 g/dL (ref 32.0–36.0)
MCV: 94.7 fL (ref 80.0–100.0)
MPV: 10.7 fL (ref 7.5–12.5)
Monocytes Relative: 6.6 %
Neutro Abs: 6970 {cells}/uL (ref 1500–7800)
Neutrophils Relative %: 69.7 %
Platelets: 225 10*3/uL (ref 140–400)
RBC: 4.36 10*6/uL (ref 3.80–5.10)
RDW: 12.6 % (ref 11.0–15.0)
Total Lymphocyte: 20.8 %
WBC: 10 10*3/uL (ref 3.8–10.8)

## 2022-12-11 LAB — LIPID PANEL
Cholesterol: 129 mg/dL (ref <200)
HDL: 60 mg/dL (ref >=50)
LDL Cholesterol (Calc): 52 mg/dL
Non-HDL Cholesterol (Calc): 69 mg/dL (ref <130)
Total CHOL/HDL Ratio: 2.2 (calc) (ref <5.0)
Triglycerides: 85 mg/dL (ref <150)

## 2022-12-11 LAB — HEMOGLOBIN A1C
Hgb A1c MFr Bld: 6.4 %{Hb} — ABNORMAL HIGH (ref <5.7)
Mean Plasma Glucose: 137 mg/dL
eAG (mmol/L): 7.6 mmol/L

## 2022-12-13 ENCOUNTER — Encounter: Payer: PPO | Admitting: Nurse Practitioner

## 2022-12-20 ENCOUNTER — Encounter: Payer: Self-pay | Admitting: Nurse Practitioner

## 2022-12-20 ENCOUNTER — Non-Acute Institutional Stay: Payer: PPO | Admitting: Nurse Practitioner

## 2022-12-20 VITALS — BP 118/70 | HR 74 | Temp 97.0°F | Resp 18 | Ht 65.0 in | Wt 127.2 lb

## 2022-12-20 DIAGNOSIS — E782 Mixed hyperlipidemia: Secondary | ICD-10-CM | POA: Diagnosis not present

## 2022-12-20 DIAGNOSIS — I2510 Atherosclerotic heart disease of native coronary artery without angina pectoris: Secondary | ICD-10-CM | POA: Diagnosis not present

## 2022-12-20 DIAGNOSIS — E1100 Type 2 diabetes mellitus with hyperosmolarity without nonketotic hyperglycemic-hyperosmolar coma (NKHHC): Secondary | ICD-10-CM

## 2022-12-20 DIAGNOSIS — N1831 Chronic kidney disease, stage 3a: Secondary | ICD-10-CM | POA: Diagnosis not present

## 2022-12-20 DIAGNOSIS — I10 Essential (primary) hypertension: Secondary | ICD-10-CM

## 2022-12-20 DIAGNOSIS — R634 Abnormal weight loss: Secondary | ICD-10-CM | POA: Diagnosis not present

## 2022-12-20 MED ORDER — METFORMIN HCL 500 MG PO TABS
500.0000 mg | ORAL_TABLET | Freq: Two times a day (BID) | ORAL | 3 refills | Status: DC
Start: 2022-12-20 — End: 2023-12-03

## 2022-12-20 NOTE — Assessment & Plan Note (Signed)
No chest pain, Hx of heart attack, takes Lipitor, Enalapril, ASA

## 2022-12-20 NOTE — Assessment & Plan Note (Signed)
not eating as much as prior, stopped alcohol drinks, feels fine, denied GI symptoms.  Reduce Metform 500mg  bid for now.

## 2022-12-20 NOTE — Assessment & Plan Note (Signed)
on Atorvastatin, LDL 52 12/11/22

## 2022-12-20 NOTE — Assessment & Plan Note (Signed)
blood pressure is controlled, takes Atenolol, Enalapril, resumed HCTZ after stopped Jardiance.

## 2022-12-20 NOTE — Assessment & Plan Note (Signed)
Pessary, followed by GYN, saw Urology in the past

## 2022-12-20 NOTE — Progress Notes (Signed)
Location:   Clinic [facility]   Place of Service:  Clinic (12) Provider: Chipper Oman NP  Code Status: DNR Goals of Care:     12/20/2022    1:48 PM  Advanced Directives  Does Patient Have a Medical Advance Directive? Yes  Type of Advance Directive Healthcare Power of Attorney  Does patient want to make changes to medical advance directive? No - Patient declined  Copy of Healthcare Power of Attorney in Chart? Yes - validated most recent copy scanned in chart (See row information)     Chief Complaint  Patient presents with   Medical Management of Chronic Issues    7 month follow up ,discuss the need for covid booster    HPI: Patient is a 89 y.o. female seen today for medical management of chronic diseases.     HTN, blood pressure is controlled, takes Atenolol, Enalapril, resumed HCTZ after stopped Jardiance.              CKD Bun/creat 21/0.99 05/15/22, off Jardiance 12/23 caused weakness.              CAD, Hx of heart attack, takes Lipitor, Enalapril, ASA             Hyperlipidemia, on Atorvastatin, LDL 52 12/11/22             T2DM, on Metformin,  desires no routine ophthalmology, podiatrist visit. Hgb a1c 7.8 05/15/22>>6.4 12/11/22             Pessary, followed by GYN, saw Urology in the past  Weight loss: not eating as much as prior, stopped alcohol drinks, feels fine, denied GI symptoms.                      Past Medical History:  Diagnosis Date   Coronary arteriosclerosis    Diabetes mellitus without complication (HCC)    Type 2    Hyperlipidemia    Hypertension    Myocardial infarction [facility])     Past Surgical History:  Procedure Laterality Date   APPENDECTOMY  1988   Dr. Theron Arista   CORONARY ARTERY BYPASS GRAFT  1990   EYE SURGERY  2012   Dr, Ether Griffins   KYPHOPLASTY  2015   Dr. Loralie Champagne    Allergies  Allergen Reactions   Bactrim [Sulfamethoxazole-Trimethoprim] Diarrhea   Shrimp [Shellfish Allergy]    Poison Ivy Extract Rash    Allergies as of 12/20/2022        Reactions   Bactrim [sulfamethoxazole-trimethoprim] Diarrhea   Shrimp [shellfish Allergy]    Poison Ivy Extract Rash        Medication List        Accurate as of December 20, 2022  4:22 PM. If you have any questions, ask your nurse or doctor.          atenolol 50 MG tablet Commonly known as: TENORMIN TAKE ONE TABLET BY MOUTH DAILY   atorvastatin 20 MG tablet Commonly known as: LIPITOR TAKE ONE TABLET BY MOUTH DAILY   Calcium Carbonate-Vitamin D3 600-400 MG-UNIT Tabs Take 600 mg by mouth 2 (two) times daily with a meal.   enalapril 20 MG tablet Commonly known as: VASOTEC TAKE ONE TABLET BY MOUTH TWICE DAILY   hydrochlorothiazide 25 MG tablet Commonly known as: HYDRODIURIL TAKE ONE TABLET BY MOUTH DAILY   metFORMIN 500 MG tablet Commonly known as: GLUCOPHAGE Take 1 tablet (500 mg total) by mouth 2 (two) times daily with a meal. What changed:  medication strength how much to take Changed by: Kavontae Pritchard X Shavon Zenz   triamcinolone cream 0.1 % Commonly known as: KENALOG APPLY TO AFFECTED AREA TWICE A DAY.        Review of Systems:  Review of Systems  Constitutional:  Negative for activity change, fatigue and fever.       Weight loss #7-8Ibs in the past 6 months.   HENT:  Positive for hearing loss. Negative for congestion and voice change.   Eyes:  Negative for visual disturbance.  Respiratory:  Negative for shortness of breath.        Occasional hacking cough for 30 more years since started ACEI  Cardiovascular:  Positive for leg swelling. Negative for chest pain.  Gastrointestinal:  Negative for abdominal pain and constipation.  Genitourinary:  Negative for dysuria and urgency.       Pessary, nocturnal urination x1 average  Musculoskeletal:  Positive for arthralgias, back pain and gait problem.  Skin:  Negative for color change.       Dry skin, itches sometimes wit random small red papules.   Neurological:  Negative for speech difficulty, weakness and headaches.   Psychiatric/Behavioral:  Positive for sleep disturbance. Negative for behavioral problems. The patient is not nervous/anxious.        Sometimes stay awake after bathroom, but taking a nap during day.     Health Maintenance  Topic Date Due   Zoster Vaccines- Shingrix (1 of 2) 03/03/2024 (Originally 07/09/1973)   COVID-19 Vaccine (6 - 2023-24 season) 01/23/2023   FOOT EXAM  05/17/2023   Medicare Annual Wellness (AWV)  06/07/2023   HEMOGLOBIN A1C  06/11/2023   Pneumonia Vaccine 80+ Years old  Completed   DEXA SCAN  Completed   HPV VACCINES  Aged Out   DTaP/Tdap/Td  Discontinued   INFLUENZA VACCINE  Discontinued   OPHTHALMOLOGY EXAM  Discontinued    Physical Exam: Vitals:   12/20/22 1350  BP: 118/70  Pulse: 74  Resp: 18  Temp: (!) 97 F (36.1 C)  SpO2: 100%  Weight: 127 lb 3.2 oz (57.7 kg)  Height: 5\' 5"  (1.651 m)   Body mass index is 21.17 kg/m. Physical Exam Vitals and nursing note reviewed.  Constitutional:      Appearance: Normal appearance.  HENT:     Head: Normocephalic and atraumatic.     Mouth/Throat:     Mouth: Mucous membranes are moist.     Pharynx: Posterior oropharyngeal erythema present.     Comments: Left side, denied pain Eyes:     Extraocular Movements: Extraocular movements intact.     Conjunctiva/sclera: Conjunctivae normal.     Pupils: Pupils are equal, round, and reactive to light.  Cardiovascular:     Rate and Rhythm: Normal rate and regular rhythm.     Heart sounds: No murmur heard.    Comments: Right radial pulse not felt.  Pulmonary:     Breath sounds: No rhonchi or rales.  Abdominal:     General: Bowel sounds are normal.     Palpations: Abdomen is soft.     Tenderness: There is no abdominal tenderness.  Musculoskeletal:     Cervical back: Normal range of motion and neck supple.     Right lower leg: Edema present.     Left lower leg: Edema present.     Comments: Trace edema in ankles  Skin:    General: Skin is warm and dry.   Neurological:     General: No focal deficit present.  Mental Status: She is alert and oriented to person, place, and time. Mental status is at baseline.     Motor: No weakness.     Gait: Gait abnormal.     Comments: Resolved right arm/hand weakness, muscle strength 5/5, ambulates with walker.   Psychiatric:        Mood and Affect: Mood normal.        Behavior: Behavior normal.        Thought Content: Thought content normal.        Judgment: Judgment normal.     Labs reviewed: Basic Metabolic Panel: Recent Labs    05/15/22 0713  NA 139  K 3.9  CL 101  CO2 29  GLUCOSE 146*  BUN 21  CREATININE 0.99*  CALCIUM 9.9   Liver Function Tests: Recent Labs    05/15/22 0713  AST 13  ALT 10  BILITOT 0.6  PROT 6.5   No results for input(s): "LIPASE", "AMYLASE" in the last 8760 hours. No results for input(s): "AMMONIA" in the last 8760 hours. CBC: Recent Labs    12/11/22 0725  WBC 10.0  NEUTROABS 6,970  HGB 13.3  HCT 41.3  MCV 94.7  PLT 225   Lipid Panel: Recent Labs    12/11/22 0725  CHOL 129  HDL 60  LDLCALC 52  TRIG 85  CHOLHDL 2.2   Lab Results  Component Value Date   HGBA1C 6.4 (H) 12/11/2022    Procedures since last visit: No results found.  Assessment/Plan  HTN (hypertension) blood pressure is controlled, takes Atenolol, Enalapril, resumed HCTZ after stopped Jardiance.   CKD (chronic kidney disease) stage 3, GFR 30-59 ml/min (HCC) Bun/creat 21/0.99 05/15/22, off Jardiance 12/23 caused weakness.  Update CBC/diff, CMP/eGFR, TSH, Hgb A1c, lipids, Vit B12, Vit D level prior to the next appt  CAD (coronary artery disease) No chest pain, Hx of heart attack, takes Lipitor, Enalapril, ASA  Hyperlipidemia on Atorvastatin, LDL 52 12/11/22  Type 2 diabetes mellitus (HCC) Metformin,  desires no routine ophthalmology, podiatrist visit. Hgb a1c 7.8 05/15/22>>6.4 12/11/22  Cystocele, unspecified (CODE)  Pessary, followed by GYN, saw Urology in the  past  Weight loss not eating as much as prior, stopped alcohol drinks, feels fine, denied GI symptoms.  Reduce Metform 500mg  bid for now.    Labs/tests ordered: CBC/diff, CMP/eGFR, TSH, Hgb A1c, lipids, Vit B12, Vit D level 07/02/23 Next appt:  07/11/23

## 2022-12-20 NOTE — Assessment & Plan Note (Signed)
Bun/creat 21/0.99 05/15/22, off Jardiance 12/23 caused weakness.  Update CBC/diff, CMP/eGFR, TSH, Hgb A1c, lipids, Vit B12, Vit D level prior to the next appt

## 2022-12-20 NOTE — Assessment & Plan Note (Addendum)
Metformin,  desires no routine ophthalmology, podiatrist visit. Hgb a1c 7.8 05/15/22>>6.4 12/11/22

## 2022-12-24 ENCOUNTER — Encounter: Payer: Self-pay | Admitting: Obstetrics and Gynecology

## 2022-12-24 ENCOUNTER — Ambulatory Visit (INDEPENDENT_AMBULATORY_CARE_PROVIDER_SITE_OTHER): Payer: PPO | Admitting: Obstetrics and Gynecology

## 2022-12-24 VITALS — BP 128/74 | HR 74 | Ht 65.0 in | Wt 127.0 lb

## 2022-12-24 DIAGNOSIS — N819 Female genital prolapse, unspecified: Secondary | ICD-10-CM | POA: Diagnosis not present

## 2022-12-24 DIAGNOSIS — Z4689 Encounter for fitting and adjustment of other specified devices: Secondary | ICD-10-CM

## 2023-02-20 ENCOUNTER — Emergency Department (HOSPITAL_COMMUNITY)
Admission: EM | Admit: 2023-02-20 | Discharge: 2023-02-20 | Disposition: A | Discharge status: Home / Self Care | Payer: PPO | Attending: Emergency Medicine | Admitting: Emergency Medicine

## 2023-02-20 ENCOUNTER — Encounter (HOSPITAL_COMMUNITY): Payer: Self-pay

## 2023-02-20 ENCOUNTER — Emergency Department (HOSPITAL_COMMUNITY): Payer: PPO

## 2023-02-20 ENCOUNTER — Other Ambulatory Visit: Payer: Self-pay

## 2023-02-20 DIAGNOSIS — Z79899 Other long term (current) drug therapy: Secondary | ICD-10-CM | POA: Diagnosis not present

## 2023-02-20 DIAGNOSIS — S59911A Unspecified injury of right forearm, initial encounter: Secondary | ICD-10-CM | POA: Diagnosis present

## 2023-02-20 DIAGNOSIS — Z23 Encounter for immunization: Secondary | ICD-10-CM | POA: Insufficient documentation

## 2023-02-20 DIAGNOSIS — S51811A Laceration without foreign body of right forearm, initial encounter: Secondary | ICD-10-CM | POA: Diagnosis not present

## 2023-02-20 DIAGNOSIS — S56921A Laceration of unspecified muscles, fascia and tendons at forearm level, right arm, initial encounter: Secondary | ICD-10-CM | POA: Diagnosis not present

## 2023-02-20 DIAGNOSIS — I10 Essential (primary) hypertension: Secondary | ICD-10-CM | POA: Diagnosis not present

## 2023-02-20 DIAGNOSIS — S51809A Unspecified open wound of unspecified forearm, initial encounter: Secondary | ICD-10-CM | POA: Diagnosis not present

## 2023-02-20 DIAGNOSIS — Z7984 Long term (current) use of oral hypoglycemic drugs: Secondary | ICD-10-CM | POA: Diagnosis not present

## 2023-02-20 DIAGNOSIS — W1809XA Striking against other object with subsequent fall, initial encounter: Secondary | ICD-10-CM | POA: Diagnosis not present

## 2023-02-20 DIAGNOSIS — E119 Type 2 diabetes mellitus without complications: Secondary | ICD-10-CM | POA: Diagnosis not present

## 2023-02-20 DIAGNOSIS — M19031 Primary osteoarthritis, right wrist: Secondary | ICD-10-CM | POA: Diagnosis not present

## 2023-02-20 DIAGNOSIS — M84439A Pathological fracture, unspecified ulna and radius, initial encounter for fracture: Secondary | ICD-10-CM | POA: Diagnosis not present

## 2023-02-20 MED ORDER — BACITRACIN ZINC 500 UNIT/GM EX OINT
TOPICAL_OINTMENT | CUTANEOUS | Status: AC
  Filled 2023-02-20: qty 1.8

## 2023-02-20 MED ORDER — TETANUS-DIPHTH-ACELL PERTUSSIS 5-2.5-18.5 LF-MCG/0.5 IM SUSY
0.5000 mL | PREFILLED_SYRINGE | Freq: Once | INTRAMUSCULAR | Status: AC
  Administered 2023-02-20: 0.5 mL via INTRAMUSCULAR
  Filled 2023-02-20: qty 0.5

## 2023-02-20 NOTE — ED Triage Notes (Signed)
 Pt has a skin tear to her right forearm after falling and hitting it on her wicker laundry basket in her bathroom.

## 2023-02-20 NOTE — ED Provider Notes (Signed)
 [place] EMERGENCY DEPARTMENT AT [facility] Provider Note   CSN: [id number] Arrival date & time: 02/20/23  0113     History  Chief Complaint  Patient presents with   skin tear    Jessica Walters is a 89 y.o. female.  HPI     This is a 89 year old female who presents with an injury to the right forearm.  Patient reports that she had gotten up to go to the bathroom when she lost her balance and hit her right arm on a wicker hamper.  She denies falling, hitting her head, or loss of consciousness.  She denies other injury.  She has been ambulatory since then.  She lives at an assisted living facility.  She does all of her ADLs independently.  Unknown last tetanus shot.  She is right-handed.  Home Medications Prior to Admission medications   Medication Sig Start Date End Date Taking? Authorizing Provider  atenolol  (TENORMIN ) 50 MG tablet TAKE ONE TABLET BY MOUTH DAILY 09/10/22   Mast, Man X, NP  atorvastatin  (LIPITOR) 20 MG tablet TAKE ONE TABLET BY MOUTH DAILY 09/10/22   Mast, Man X, NP  Calcium  Carbonate-Vitamin D3 600-400 MG-UNIT TABS Take 600 mg by mouth 2 (two) times daily with a meal. 11/17/15   Mast, Man X, NP  enalapril  (VASOTEC ) 20 MG tablet TAKE ONE TABLET BY MOUTH TWICE DAILY 09/10/22   Mast, Man X, NP  hydrochlorothiazide  (HYDRODIURIL ) 25 MG tablet TAKE ONE TABLET BY MOUTH DAILY 09/10/22   Mast, Man X, NP  metFORMIN  (GLUCOPHAGE ) 500 MG tablet Take 1 tablet (500 mg total) by mouth 2 (two) times daily with a meal. 12/20/22   Mast, Man X, NP  triamcinolone  cream (KENALOG ) 0.1 % APPLY TO AFFECTED AREA TWICE A DAY. 09/10/22   Mast, Man X, NP      Allergies    Bactrim  [sulfamethoxazole -trimethoprim ], Shrimp [shellfish allergy], and Poison ivy extract    Review of Systems   Review of Systems  Skin:  Positive for wound. Negative for color change.  All other systems reviewed and are negative.   Physical Exam Updated Vital Signs BP (!) 193/84 (BP Location: Left  Arm)   Pulse 81   Temp 97.8 F (36.6 C) (Oral)   Resp 18   Ht 1.651 m (5' 5)   Wt 57.6 kg   SpO2 94%   BMI 21.13 kg/m  Physical Exam Vitals and nursing note reviewed.  Constitutional:      Appearance: She is well-developed. She is not ill-appearing.  HENT:     Head: Normocephalic and atraumatic.  Eyes:     Pupils: Pupils are equal, round, and reactive to light.  Cardiovascular:     Rate and Rhythm: Normal rate and regular rhythm.     Heart sounds: Normal heart sounds.  Pulmonary:     Effort: Pulmonary effort is normal. No respiratory distress.     Breath sounds: No wheezing.  Abdominal:     Palpations: Abdomen is soft.     Tenderness: There is no abdominal tenderness.  Musculoskeletal:     Cervical back: Neck supple.     Comments: Focused examination of the right arm with T-shaped skin tear noted over the forearm, no obvious deformities, no significant tenderness to palpation, flexion extension of the wrist intact, 2+ radial pulse  Skin:    General: Skin is warm and dry.  Neurological:     Mental Status: She is alert and oriented to person, place, and time.  Psychiatric:        Mood and Affect: Mood normal.     ED Results / Procedures / Treatments   Labs (all labs ordered are listed, but only abnormal results are displayed) Labs Reviewed - No data to display  EKG None  Radiology DG Forearm Right Result Date: 02/20/2023 CLINICAL DATA:  Patient fell this morning in bathroom and hit arm on laundry hamper. Skin tear posterior surface forearm. EXAM: RIGHT FOREARM - 2 VIEW COMPARISON:  None Available. FINDINGS: Demineralization. No acute fracture or dislocation. Chronic ulnar styloid fracture. Degenerative arthritis about the wrist. Soft tissue laceration about the posterior mid forearm. IMPRESSION: No acute fracture or dislocation. Electronically Signed   By: Norman Gatlin M.D.   On: 02/20/2023 02:28    Procedures .Laceration Repair  Date/Time: 02/20/2023 3:02  AM  Performed by: Bari Charmaine FALCON, MD Authorized by: Bari Charmaine FALCON, MD   Consent:    Consent obtained:  Verbal   Consent given by:  Patient   Risks discussed:  Infection, poor cosmetic result, pain and poor wound healing   Alternatives discussed:  No treatment Universal protocol:    Patient identity confirmed:  Verbally with patient Anesthesia:    Anesthesia method:  None Laceration details:    Location:  Shoulder/arm   Shoulder/arm location:  R lower arm   Length (cm):  7.5   Depth (mm):  2 Exploration:    Limited defect created (wound extended): no     Wound extent: no foreign body, no nerve damage, no tendon damage, no underlying fracture and no vascular damage     Contaminated: no   Treatment:    Area cleansed with:  Saline   Amount of cleaning:  Standard   Debridement:  None   Undermining:  None Skin repair:    Repair method:  Steri-Strips   Number of Steri-Strips:  5 Approximation:    Approximation:  Loose Repair type:    Repair type:  Simple Post-procedure details:    Dressing:  Antibiotic ointment and non-adherent dressing   Procedure completion:  Tolerated     Medications Ordered in ED Medications  bacitracin  500 UNIT/GM ointment (has no administration in time range)  Tdap (BOOSTRIX) injection 0.5 mL (0.5 mLs Intramuscular Given 02/20/23 0243)    ED Course/ Medical Decision Making/ A&P                                 Medical Decision Making Amount and/or Complexity of Data Reviewed Radiology: ordered.  Risk Prescription drug management.   This patient presents to the ED for concern of skin injury, this involves an extensive number of treatment options, and is a complaint that carries with it a high risk of complications and morbidity.  I considered the following differential and admission for this acute, potentially life threatening condition.  The differential diagnosis includes skin tear, laceration, fracture  MDM:    This is a  89 year old female who presents with an injury to the right forearm.  Apparent skin tear.  No obvious deformities.  Denies other injury.  She is generally well-appearing.  She is significantly hypertensive.  Tetanus was updated.  X-rays do not show any evidence of underlying fracture.  Discussed with the patient and her family that sutures would likely tear through the skin given how thin it is.  Steri-Strips were placed to help with approximation and copious antibiotic ointment with a nonadherent dressing was applied.  We discussed wound management.  (Labs, imaging, consults)  Labs: I Ordered, and personally interpreted labs.  The pertinent results include: None  Imaging Studies ordered: I ordered imaging studies including x-ray forearm I independently visualized and interpreted imaging. I agree with the radiologist interpretation  Additional history obtained from chart review.  External records from outside source obtained and reviewed including prior evaluations  Cardiac Monitoring: The patient was not maintained on a cardiac monitor.  If on the cardiac monitor, I personally viewed and interpreted the cardiac monitored which showed an underlying rhythm of: N/A  Reevaluation: After the interventions noted above, I reevaluated the patient and found that they have :improved  Social Determinants of Health:  lives independently  Disposition: discharge  Co morbidities that complicate the patient evaluation  Past Medical History:  Diagnosis Date   Coronary arteriosclerosis    Diabetes mellitus without complication (HCC)    Type 2    Hyperlipidemia    Hypertension    Myocardial infarction (HCC)      Medicines Meds ordered this encounter  Medications   Tdap (BOOSTRIX) injection 0.5 mL   bacitracin  500 UNIT/GM ointment    Abran Neptune S: cabinet override    I have reviewed the patients home medicines and have made adjustments as needed  Problem List / ED Course: Problem List  Items Addressed This Visit   None Visit Diagnoses       Skin tear of right forearm without complication, initial encounter    -  Primary                   Final Clinical Impression(s) / ED Diagnoses Final diagnoses:  Skin tear of right forearm without complication, initial encounter    Rx / DC Orders ED Discharge Orders     None         Bari Charmaine FALCON, MD 02/20/23 [phone]

## 2023-02-20 NOTE — Discharge Instructions (Signed)
 You were seen today for a skin tear to the forearm.  Steri-Strips were placed to help approximate.  Make sure that you are keeping it moist with antibiotic ointment.  When you change the dressing, make sure to use a nonadherent dressing over antibiotic ointment to keep it from sticking.

## 2023-02-20 NOTE — ED Notes (Signed)
 Pt DC instruction explained to pt and all family, all questions answered at time of DC, NAD noted at this time

## 2023-02-21 ENCOUNTER — Encounter: Payer: Self-pay | Admitting: Nurse Practitioner

## 2023-02-21 ENCOUNTER — Non-Acute Institutional Stay: Payer: PPO | Admitting: Nurse Practitioner

## 2023-02-21 VITALS — BP 136/84 | HR 82 | Temp 97.6°F | Resp 19 | Ht 65.0 in | Wt 126.8 lb

## 2023-02-21 DIAGNOSIS — E1100 Type 2 diabetes mellitus with hyperosmolarity without nonketotic hyperglycemic-hyperosmolar coma (NKHHC): Secondary | ICD-10-CM | POA: Diagnosis not present

## 2023-02-21 DIAGNOSIS — S51812D Laceration without foreign body of left forearm, subsequent encounter: Secondary | ICD-10-CM

## 2023-02-21 DIAGNOSIS — S51819A Laceration without foreign body of unspecified forearm, initial encounter: Secondary | ICD-10-CM | POA: Insufficient documentation

## 2023-02-21 DIAGNOSIS — I10 Essential (primary) hypertension: Secondary | ICD-10-CM | POA: Diagnosis not present

## 2023-02-21 DIAGNOSIS — N1831 Chronic kidney disease, stage 3a: Secondary | ICD-10-CM | POA: Diagnosis not present

## 2023-02-21 DIAGNOSIS — E782 Mixed hyperlipidemia: Secondary | ICD-10-CM

## 2023-02-21 DIAGNOSIS — I2510 Atherosclerotic heart disease of native coronary artery without angina pectoris: Secondary | ICD-10-CM

## 2023-02-21 DIAGNOSIS — S81819A Laceration without foreign body, unspecified lower leg, initial encounter: Secondary | ICD-10-CM | POA: Insufficient documentation

## 2023-02-21 MED ORDER — DOXYCYCLINE HYCLATE 100 MG PO TABS: 100.0000 mg | ORAL_TABLET | Freq: Two times a day (BID) | ORAL | 0 refills | Status: AC

## 2023-02-21 NOTE — Assessment & Plan Note (Signed)
 Bun/creat 21/0.99 05/15/22, off Jardiance 12/23 caused weakness.

## 2023-02-21 NOTE — Assessment & Plan Note (Signed)
 blood pressure is controlled, takes Atenolol, Enalapril, resumed HCTZ after stopped Jardiance.

## 2023-02-21 NOTE — Assessment & Plan Note (Signed)
 on Atorvastatin, LDL 52 12/11/22

## 2023-02-21 NOTE — Assessment & Plan Note (Signed)
 on Metformin,  desires no routine ophthalmology, podiatrist visit. Hgb a1c 7.8 05/15/22>>6.4 12/11/22

## 2023-02-21 NOTE — Progress Notes (Signed)
 Location:   clinic [facility]   Place of Service:  Clinic (12) Provider: Larwance Hark NP  Code Status: DNR Goals of Care:     02/21/2023    2:50 PM  Advanced Directives  Does Patient Have a Medical Advance Directive? No  Would patient like information on creating a medical advance directive? No - Patient declined     Chief Complaint  Patient presents with   Transitions Of Care    Follow up from fall from emergency room.    HPI: Patient is a 89 y.o. female seen today for medical management of chronic diseases.    ED eval 02/20/23 fall, skin tear R forearm xray negative for Fxs  HTN, blood pressure is controlled, takes Atenolol , Enalapril , resumed HCTZ after stopped Jardiance .              CKD Bun/creat 21/0.99 05/15/22, off Jardiance  12/23 caused weakness.              CAD, Hx of heart attack, takes Lipitor, Enalapril , ASA             Hyperlipidemia, on Atorvastatin , LDL 52 12/11/22             T2DM, on Metformin ,  desires no routine ophthalmology, podiatrist visit. Hgb a1c 7.8 05/15/22>>6.4 12/11/22             Pessary, followed by GYN, saw Urology in the past             Weight loss: not eating as much as prior, stopped alcohol drinks, feels fine, denied GI symptoms.                 Past Medical History:  Diagnosis Date   Coronary arteriosclerosis    Diabetes mellitus without complication (HCC)    Type 2    Hyperlipidemia    Hypertension    Myocardial infarction [facility])     Past Surgical History:  Procedure Laterality Date   APPENDECTOMY  1988   Dr. Maude   CORONARY ARTERY BYPASS GRAFT  1990   EYE SURGERY  2012   Dr, Ashley   KYPHOPLASTY  2015   Dr. Renato    Allergies  Allergen Reactions   Bactrim  [Sulfamethoxazole -Trimethoprim ] Diarrhea   Shrimp [Shellfish Allergy]    Poison Ivy Extract Rash    Allergies as of 02/21/2023       Reactions   Bactrim  [sulfamethoxazole -trimethoprim ] Diarrhea   Shrimp [shellfish Allergy]    Poison Ivy Extract Rash         Medication List        Accurate as of February 21, 2023 11:59 PM. If you have any questions, ask your nurse or doctor.          atenolol  50 MG tablet Commonly known as: TENORMIN  TAKE ONE TABLET BY MOUTH DAILY   atorvastatin  20 MG tablet Commonly known as: LIPITOR TAKE ONE TABLET BY MOUTH DAILY   Calcium  Carbonate-Vitamin D3 600-400 MG-UNIT Tabs Take 600 mg by mouth 2 (two) times daily with a meal.   doxycycline  100 MG tablet Commonly known as: VIBRA -TABS Take 1 tablet (100 mg total) by mouth 2 (two) times daily for 7 days. Started by: Bueford Arp X Onda Kattner   enalapril  20 MG tablet Commonly known as: VASOTEC  TAKE ONE TABLET BY MOUTH TWICE DAILY   hydrochlorothiazide  25 MG tablet Commonly known as: HYDRODIURIL  TAKE ONE TABLET BY MOUTH DAILY   metFORMIN  500 MG tablet Commonly known as: GLUCOPHAGE  Take 1 tablet (500 mg total)  by mouth 2 (two) times daily with a meal.   triamcinolone  cream 0.1 % Commonly known as: KENALOG  APPLY TO AFFECTED AREA TWICE A DAY.        Review of Systems:  Review of Systems  Constitutional:  Negative for activity change, fatigue and fever.       Weight loss #7-8Ibs in the past 6 months.   HENT:  Positive for hearing loss. Negative for congestion and voice change.   Eyes:  Negative for visual disturbance.  Respiratory:  Negative for shortness of breath.        Occasional hacking cough for 30 more years since started ACEI  Cardiovascular:  Positive for leg swelling. Negative for chest pain.  Gastrointestinal:  Negative for abdominal pain and constipation.  Genitourinary:  Negative for dysuria and urgency.       Pessary, nocturnal urination x1 average  Musculoskeletal:  Positive for arthralgias, back pain and gait problem.  Skin:  Positive for wound. Negative for color change.       Dry skin, itches sometimes wit random small red papules.   Neurological:  Negative for speech difficulty, weakness and headaches.  Psychiatric/Behavioral:  Positive  for sleep disturbance. Negative for behavioral problems. The patient is not nervous/anxious.        Sometimes stay awake after bathroom, but taking a nap during day.     Health Maintenance  Topic Date Due   COVID-19 Vaccine (6 - 2024-25 season) 01/23/2023   Zoster Vaccines- Shingrix (1 of 2) 03/03/2024 (Originally 07/09/1973)   FOOT EXAM  05/17/2023   Medicare Annual Wellness (AWV)  06/07/2023   HEMOGLOBIN A1C  06/11/2023   Pneumonia Vaccine 30+ Years old  Completed   DEXA SCAN  Completed   HPV VACCINES  Aged Out   DTaP/Tdap/Td  Discontinued   INFLUENZA VACCINE  Discontinued   OPHTHALMOLOGY EXAM  Discontinued    Physical Exam: Vitals:   02/21/23 1452  BP: 136/84  Pulse: 82  Resp: 19  Temp: 97.6 F (36.4 C)  SpO2: 91%  Weight: 126 lb 12.8 oz (57.5 kg)  Height: 5' 5 (1.651 m)   Body mass index is 21.1 kg/m. Physical Exam Vitals and nursing note reviewed.  Constitutional:      Appearance: Normal appearance.  HENT:     Head: Normocephalic and atraumatic.     Mouth/Throat:     Mouth: Mucous membranes are moist.     Pharynx: Posterior oropharyngeal erythema present.     Comments: Left side, denied pain Eyes:     Extraocular Movements: Extraocular movements intact.     Conjunctiva/sclera: Conjunctivae normal.     Pupils: Pupils are equal, round, and reactive to light.  Cardiovascular:     Rate and Rhythm: Normal rate and regular rhythm.     Heart sounds: No murmur heard.    Comments: Right radial pulse not felt.  Pulmonary:     Breath sounds: No rhonchi or rales.  Abdominal:     General: Bowel sounds are normal.     Palpations: Abdomen is soft.     Tenderness: There is no abdominal tenderness.  Musculoskeletal:     Cervical back: Normal range of motion and neck supple.     Right lower leg: Edema present.     Left lower leg: Edema present.     Comments: Trace edema in ankles  Skin:    General: Skin is warm and dry.     Findings: Bruising present.     Comments:  Right  forearm skin tear, no active bleeding, mild warmth, no odorous drainage, swelling, admitted mild discomfort.   Neurological:     General: No focal deficit present.     Mental Status: She is alert and oriented to person, place, and time. Mental status is at baseline.     Motor: No weakness.     Gait: Gait abnormal.     Comments: Resolved right arm/hand weakness, muscle strength 5/5, ambulates with walker.   Psychiatric:        Mood and Affect: Mood normal.        Behavior: Behavior normal.        Thought Content: Thought content normal.        Judgment: Judgment normal.     Labs reviewed: Basic Metabolic Panel: Recent Labs    05/15/22 0713  NA 139  K 3.9  CL 101  CO2 29  GLUCOSE 146*  BUN 21  CREATININE 0.99*  CALCIUM  9.9   Liver Function Tests: Recent Labs    05/15/22 0713  AST 13  ALT 10  BILITOT 0.6  PROT 6.5   No results for input(s): LIPASE, AMYLASE in the last 8760 hours. No results for input(s): AMMONIA in the last 8760 hours. CBC: Recent Labs    12/11/22 0725  WBC 10.0  NEUTROABS 6,970  HGB 13.3  HCT 41.3  MCV 94.7  PLT 225   Lipid Panel: Recent Labs    12/11/22 0725  CHOL 129  HDL 60  LDLCALC 52  TRIG 85  CHOLHDL 2.2   Lab Results  Component Value Date   HGBA1C 6.4 (H) 12/11/2022    Procedures since last visit: DG Forearm Right Result Date: 02/20/2023 CLINICAL DATA:  Patient fell this morning in bathroom and hit arm on laundry hamper. Skin tear posterior surface forearm. EXAM: RIGHT FOREARM - 2 VIEW COMPARISON:  None Available. FINDINGS: Demineralization. No acute fracture or dislocation. Chronic ulnar styloid fracture. Degenerative arthritis about the wrist. Soft tissue laceration about the posterior mid forearm. IMPRESSION: No acute fracture or dislocation. Electronically Signed   By: Norman Gatlin M.D.   On: 02/20/2023 02:28    Assessment/Plan  Skin tear of forearm without complication ED eval 02/20/23 fall, skin tear R  forearm xray negative for Fxs Mild warmth, no active bleeding or odorous drainage, discomfort, no apparent swelling Doxycycline  100mg  bid x 7 days, non adhesive dressing daily and prn over xeroform   HTN (hypertension) blood pressure is controlled, takes Atenolol , Enalapril , resumed HCTZ after stopped Jardiance .   CKD (chronic kidney disease) stage 3, GFR 30-59 ml/min (HCC) Bun/creat 21/0.99 05/15/22, off Jardiance  12/23 caused weakness.   CAD (coronary artery disease)  Hx of heart attack, takes Lipitor, Enalapril , ASA  Hyperlipidemia on Atorvastatin , LDL 52 12/11/22  Type 2 diabetes mellitus (HCC) on Metformin ,  desires no routine ophthalmology, podiatrist visit. Hgb a1c 7.8 05/15/22>>6.4 12/11/22  Cystocele, unspecified (CODE)   Pessary, followed by GYN, saw Urology in the past   Labs/tests ordered:  none  Next appt: 2 weeks

## 2023-02-21 NOTE — Assessment & Plan Note (Signed)
 Hx of heart attack, takes Lipitor, Enalapril, ASA

## 2023-02-21 NOTE — Assessment & Plan Note (Addendum)
 ED eval 02/20/23 fall, skin tear R forearm xray negative for Fxs Mild warmth, no active bleeding or odorous drainage, discomfort, no apparent swelling Doxycycline 100mg  bid x 7 days, non adhesive dressing daily and prn over xeroform

## 2023-02-21 NOTE — Assessment & Plan Note (Signed)
 Pessary, followed by GYN, saw Urology in the past

## 2023-02-25 ENCOUNTER — Encounter: Payer: Self-pay | Admitting: Nurse Practitioner

## 2023-03-07 ENCOUNTER — Non-Acute Institutional Stay: Payer: PPO | Admitting: Nurse Practitioner

## 2023-03-07 ENCOUNTER — Encounter: Payer: Self-pay | Admitting: Nurse Practitioner

## 2023-03-07 VITALS — BP 122/78 | HR 72 | Temp 98.7°F | Ht 65.0 in | Wt 128.0 lb

## 2023-03-07 DIAGNOSIS — I10 Essential (primary) hypertension: Secondary | ICD-10-CM

## 2023-03-07 DIAGNOSIS — E1100 Type 2 diabetes mellitus with hyperosmolarity without nonketotic hyperglycemic-hyperosmolar coma (NKHHC): Secondary | ICD-10-CM | POA: Diagnosis not present

## 2023-03-07 DIAGNOSIS — E785 Hyperlipidemia, unspecified: Secondary | ICD-10-CM

## 2023-03-07 DIAGNOSIS — I2510 Atherosclerotic heart disease of native coronary artery without angina pectoris: Secondary | ICD-10-CM | POA: Diagnosis not present

## 2023-03-07 DIAGNOSIS — N1831 Chronic kidney disease, stage 3a: Secondary | ICD-10-CM | POA: Diagnosis not present

## 2023-03-07 DIAGNOSIS — I252 Old myocardial infarction: Secondary | ICD-10-CM | POA: Diagnosis not present

## 2023-03-07 DIAGNOSIS — E782 Mixed hyperlipidemia: Secondary | ICD-10-CM

## 2023-03-07 MED ORDER — ATORVASTATIN CALCIUM 20 MG PO TABS: 20.0000 mg | ORAL_TABLET | Freq: Every day | ORAL | 3 refills | Status: DC

## 2023-03-07 MED ORDER — ATENOLOL 50 MG PO TABS: 50.0000 mg | ORAL_TABLET | Freq: Every day | ORAL | 3 refills | Status: DC

## 2023-03-07 MED ORDER — HYDROCHLOROTHIAZIDE 25 MG PO TABS: 25.0000 mg | ORAL_TABLET | Freq: Every day | ORAL | 3 refills | Status: AC

## 2023-03-07 MED ORDER — ENALAPRIL MALEATE 20 MG PO TABS: 20.0000 mg | ORAL_TABLET | Freq: Two times a day (BID) | ORAL | 3 refills | Status: DC

## 2023-03-07 NOTE — Assessment & Plan Note (Signed)
Hx of heart attack, takes Lipitor, Enalapril, ASA

## 2023-03-07 NOTE — Assessment & Plan Note (Signed)
on Metformin,  desires no routine ophthalmology, podiatrist visit. Hgb a1c 7.8 05/15/22>>6.4 12/11/22

## 2023-03-07 NOTE — Assessment & Plan Note (Signed)
followed by GYN, saw Urology in the past

## 2023-03-07 NOTE — Assessment & Plan Note (Signed)
blood pressure is controlled, takes Atenolol, Enalapril, resumed HCTZ after stopped Jardiance.

## 2023-03-07 NOTE — Progress Notes (Signed)
Location:   Clinic [facility]   Place of Service:  Clinic (12) Provider: Chipper Oman NP  Code Status: DNR Goals of Care:     03/07/2023    9:01 AM  Advanced Directives  Does Patient Have a Medical Advance Directive? Yes  Type of Advance Directive Healthcare Power of Attorney  Does patient want to make changes to medical advance directive? No - Patient declined  Copy of Healthcare Power of Attorney in Chart? No - copy requested     Chief Complaint  Patient presents with  . Follow-up    2 week follow-up on right arm laceration     HPI: Patient is a 89 y.o. female seen today for medical management of chronic diseases.    ED eval 02/20/23 fall, skin tear R forearm xray negative for Fxs, healing nicely, treated with 7 day course of Doxy             HTN, blood pressure is controlled, takes Atenolol, Enalapril, resumed HCTZ after stopped Jardiance.              CKD Bun/creat 21/0.99 05/15/22, off Jardiance 12/23 caused weakness.              CAD, Hx of heart attack, takes Lipitor, Enalapril, ASA             Hyperlipidemia, on Atorvastatin, LDL 52 12/11/22             T2DM, on Metformin,  desires no routine ophthalmology, podiatrist visit. Hgb a1c 7.8 05/15/22>>6.4 12/11/22             Pessary, followed by GYN, saw Urology in the past             Weight loss: not eating as much as prior, stopped alcohol drinks, feels fine, denied GI symptoms.                 Past Medical History:  Diagnosis Date  . Coronary arteriosclerosis   . Diabetes mellitus without complication (HCC)    Type 2   . Hyperlipidemia   . Hypertension   . Myocardial infarction [facility])     Past Surgical History:  Procedure Laterality Date  . APPENDECTOMY  1988   Dr. Theron Arista  . CORONARY ARTERY BYPASS GRAFT  1990  . EYE SURGERY  2012   Dr, Ether Griffins  . KYPHOPLASTY  2015   Dr. Loralie Champagne    Allergies  Allergen Reactions  . Bactrim [Sulfamethoxazole-Trimethoprim] Diarrhea  . Shrimp [Shellfish Allergy]   . Poison Ivy Extract  Rash    Allergies as of 03/07/2023       Reactions   Bactrim [sulfamethoxazole-trimethoprim] Diarrhea   Shrimp [shellfish Allergy]    Poison Ivy Extract Rash        Medication List        Accurate as of March 07, 2023 11:59 PM. If you have any questions, ask your nurse or doctor.          atenolol 50 MG tablet Commonly known as: TENORMIN Take 1 tablet (50 mg total) by mouth daily.   atorvastatin 20 MG tablet Commonly known as: LIPITOR Take 1 tablet (20 mg total) by mouth daily.   Calcium Carbonate-Vitamin D3 600-400 MG-UNIT Tabs Take 600 mg by mouth 2 (two) times daily with a meal.   enalapril 20 MG tablet Commonly known as: VASOTEC Take 1 tablet (20 mg total) by mouth 2 (two) times daily.   hydrochlorothiazide 25 MG tablet Commonly known as: HYDRODIURIL  Take 1 tablet (25 mg total) by mouth daily.   metFORMIN 500 MG tablet Commonly known as: GLUCOPHAGE Take 1 tablet (500 mg total) by mouth 2 (two) times daily with a meal.   triamcinolone cream 0.1 % Commonly known as: KENALOG APPLY TO AFFECTED AREA TWICE A DAY.        Review of Systems:  Review of Systems  Constitutional:  Negative for activity change, fatigue and fever.       Weight loss #7-8Ibs in the past 6 months.   HENT:  Positive for hearing loss. Negative for congestion and voice change.   Eyes:  Negative for visual disturbance.  Respiratory:  Negative for shortness of breath.        Occasional hacking cough for 30 more years since started ACEI  Cardiovascular:  Positive for leg swelling. Negative for chest pain.  Gastrointestinal:  Negative for abdominal pain and constipation.  Genitourinary:  Negative for dysuria and urgency.       Pessary, nocturnal urination x1 average  Musculoskeletal:  Positive for arthralgias, back pain and gait problem.  Skin:  Negative for color change and wound.       Dry skin, itches sometimes with random small red papules.   Neurological:  Negative for speech  difficulty, weakness and headaches.  Psychiatric/Behavioral:  Positive for sleep disturbance. Negative for behavioral problems. The patient is not nervous/anxious.        Sometimes stay awake after bathroom, but taking a nap during day.     Health Maintenance  Topic Date Due  . COVID-19 Vaccine (6 - 2024-25 season) 01/23/2023  . Zoster Vaccines- Shingrix (1 of 2) 03/03/2024 (Originally 07/09/1973)  . FOOT EXAM  05/17/2023  . Medicare Annual Wellness (AWV)  06/07/2023  . HEMOGLOBIN A1C  06/11/2023  . Pneumonia Vaccine 18+ Years old  Completed  . DEXA SCAN  Completed  . HPV VACCINES  Aged Out  . DTaP/Tdap/Td  Discontinued  . INFLUENZA VACCINE  Discontinued  . OPHTHALMOLOGY EXAM  Discontinued    Physical Exam: Vitals:   03/07/23 0857  BP: 122/78  Pulse: 72  Temp: 98.7 F (37.1 C)  TempSrc: Temporal  SpO2: 97%  Weight: 128 lb (58.1 kg)  Height: 5\' 5"  (1.651 m)   Body mass index is 21.3 kg/m. Physical Exam Vitals and nursing note reviewed.  Constitutional:      Appearance: Normal appearance.  HENT:     Head: Normocephalic and atraumatic.     Mouth/Throat:     Mouth: Mucous membranes are moist.     Pharynx: Posterior oropharyngeal erythema present.     Comments: Left side, denied pain Eyes:     Extraocular Movements: Extraocular movements intact.     Conjunctiva/sclera: Conjunctivae normal.     Pupils: Pupils are equal, round, and reactive to light.  Cardiovascular:     Rate and Rhythm: Normal rate and regular rhythm.     Heart sounds: No murmur heard.    Comments: Right radial pulse not felt.  Pulmonary:     Breath sounds: No rhonchi or rales.  Abdominal:     General: Bowel sounds are normal.     Palpations: Abdomen is soft.     Tenderness: There is no abdominal tenderness.  Musculoskeletal:     Cervical back: Normal range of motion and neck supple.     Right lower leg: Edema present.     Left lower leg: Edema present.     Comments: Trace edema in ankles   Skin:  General: Skin is warm and dry.     Findings: No bruising.     Comments: Right forearm skin tear, healing nicely.   Neurological:     General: No focal deficit present.     Mental Status: She is alert and oriented to person, place, and time. Mental status is at baseline.     Motor: No weakness.     Gait: Gait abnormal.     Comments: Resolved right arm/hand weakness, muscle strength 5/5, ambulates with walker.   Psychiatric:        Mood and Affect: Mood normal.        Behavior: Behavior normal.        Thought Content: Thought content normal.        Judgment: Judgment normal.    Labs reviewed: Basic Metabolic Panel: Recent Labs    05/15/22 0713  NA 139  K 3.9  CL 101  CO2 29  GLUCOSE 146*  BUN 21  CREATININE 0.99*  CALCIUM 9.9   Liver Function Tests: Recent Labs    05/15/22 0713  AST 13  ALT 10  BILITOT 0.6  PROT 6.5   No results for input(s): "LIPASE", "AMYLASE" in the last 8760 hours. No results for input(s): "AMMONIA" in the last 8760 hours. CBC: Recent Labs    12/11/22 0725  WBC 10.0  NEUTROABS 6,970  HGB 13.3  HCT 41.3  MCV 94.7  PLT 225   Lipid Panel: Recent Labs    12/11/22 0725  CHOL 129  HDL 60  LDLCALC 52  TRIG 85  CHOLHDL 2.2   Lab Results  Component Value Date   HGBA1C 6.4 (H) 12/11/2022    Procedures since last visit: DG Forearm Right Result Date: 02/20/2023 CLINICAL DATA:  Patient fell this morning in bathroom and hit arm on laundry hamper. Skin tear posterior surface forearm. EXAM: RIGHT FOREARM - 2 VIEW COMPARISON:  None Available. FINDINGS: Demineralization. No acute fracture or dislocation. Chronic ulnar styloid fracture. Degenerative arthritis about the wrist. Soft tissue laceration about the posterior mid forearm. IMPRESSION: No acute fracture or dislocation. Electronically Signed   By: Minerva Fester M.D.   On: 02/20/2023 02:28    Assessment/Plan  HTN (hypertension) blood pressure is controlled, takes Atenolol,  Enalapril, resumed HCTZ after stopped Jardiance.   CKD (chronic kidney disease) stage 3, GFR 30-59 ml/min (HCC) Bun/creat 21/0.99 05/15/22, off Jardiance 12/23 caused weakness.   CAD (coronary artery disease) Hx of heart attack, takes Lipitor, Enalapril, ASA  Hyperlipidemia on Atorvastatin, LDL 52 12/11/22  Type 2 diabetes mellitus (HCC)  on Metformin,  desires no routine ophthalmology, podiatrist visit. Hgb a1c 7.8 05/15/22>>6.4 12/11/22  Cystocele, unspecified (CODE) followed by GYN, saw Urology in the past   Labs/tests ordered:  none  Next appt:  07/02/2023

## 2023-03-07 NOTE — Assessment & Plan Note (Signed)
Bun/creat 21/0.99 05/15/22, off Jardiance 12/23 caused weakness.

## 2023-03-07 NOTE — Assessment & Plan Note (Signed)
 on Atorvastatin, LDL 52 12/11/22

## 2023-03-08 ENCOUNTER — Encounter: Payer: Self-pay | Admitting: Nurse Practitioner

## 2023-03-28 ENCOUNTER — Non-Acute Institutional Stay: Payer: PPO | Admitting: Nurse Practitioner

## 2023-03-28 VITALS — BP 122/78 | HR 65 | Temp 97.6°F | Resp 16 | Wt 126.8 lb

## 2023-03-28 DIAGNOSIS — I10 Essential (primary) hypertension: Secondary | ICD-10-CM | POA: Diagnosis not present

## 2023-03-28 DIAGNOSIS — S51812D Laceration without foreign body of left forearm, subsequent encounter: Secondary | ICD-10-CM | POA: Diagnosis not present

## 2023-03-28 DIAGNOSIS — I2510 Atherosclerotic heart disease of native coronary artery without angina pectoris: Secondary | ICD-10-CM

## 2023-03-28 DIAGNOSIS — E782 Mixed hyperlipidemia: Secondary | ICD-10-CM

## 2023-03-28 DIAGNOSIS — N1831 Chronic kidney disease, stage 3a: Secondary | ICD-10-CM

## 2023-03-28 NOTE — Assessment & Plan Note (Signed)
on Atorvastatin, LDL 52 12/11/22

## 2023-03-28 NOTE — Progress Notes (Unsigned)
Location:   [facility]   Place of Service:   [facility] Provider: [facility] Larayah Clute NP  Austan Nicholl X, NP  Patient Care Team: Beverlyann Broxterman X, NP as PCP - General (Internal Medicine) Dawana Asper X, NP as Nurse Practitioner (Internal Medicine)  Extended Emergency Contact Information Primary Emergency Contact: Phillips,Hal Address: [address]          [place], [place] [address] [place] Home Phone: [phone] Relation: Son  Code Status: DNR Goals of care: Advanced Directive information    03/07/2023    9:01 AM  Advanced Directives  Does Patient Have a Medical Advance Directive? Yes  Type of Advance Directive Healthcare Power of Attorney  Does patient want to make changes to medical advance directive? No - Patient declined  Copy of Healthcare Power of Attorney in Chart? No - copy requested     Chief Complaint  Patient presents with  . Acute Visit    wound on right arm may need antibiotics    HPI:  Pt is a 89 y.o. female seen today for an acute visit for f/u wound right forearm, healing nicely, fully treated with Doxycyline, superficial areas remained no s/s of infection.   ED eval 02/20/23 fall, skin tear R forearm xray negative for Fxs, healing nicely, treated with 7 day course of Doxy             HTN, blood pressure is controlled, takes Atenolol, Enalapril, resumed HCTZ after stopped Jardiance.              CKD Bun/creat 21/0.99 05/15/22, off Jardiance 12/23 caused weakness.              CAD, Hx of heart attack, takes Lipitor, Enalapril, ASA             Hyperlipidemia, on Atorvastatin, LDL 52 12/11/22             T2DM, on Metformin,  desires no routine ophthalmology, podiatrist visit. Hgb a1c 7.8 05/15/22>>6.4 12/11/22             Pessary, followed by GYN, saw Urology in the past             Weight loss: not eating as much as prior, stopped alcohol drinks, feels fine, denied GI symptoms.    Past Medical History:  Diagnosis Date  . Coronary arteriosclerosis   . Diabetes  mellitus without complication (HCC)    Type 2   . Hyperlipidemia   . Hypertension   . Myocardial infarction [facility])    Past Surgical History:  Procedure Laterality Date  . APPENDECTOMY  1988   Dr. Theron Arista  . CORONARY ARTERY BYPASS GRAFT  1990  . EYE SURGERY  2012   Dr, Ether Griffins  . KYPHOPLASTY  2015   Dr. Loralie Champagne    Allergies  Allergen Reactions  . Bactrim [Sulfamethoxazole-Trimethoprim] Diarrhea  . Shrimp [Shellfish Allergy]   . Poison Ivy Extract Rash    Allergies as of 03/28/2023       Reactions   Bactrim [sulfamethoxazole-trimethoprim] Diarrhea   Shrimp [shellfish Allergy]    Poison Ivy Extract Rash        Medication List        Accurate as of March 28, 2023  5:00 PM. If you have any questions, ask your nurse or doctor.          atenolol 50 MG tablet Commonly known as: TENORMIN Take 1 tablet (50 mg total) by mouth daily.   atorvastatin 20  MG tablet Commonly known as: LIPITOR Take 1 tablet (20 mg total) by mouth daily.   Calcium Carbonate-Vitamin D3 600-400 MG-UNIT Tabs Take 600 mg by mouth 2 (two) times daily with a meal.   enalapril 20 MG tablet Commonly known as: VASOTEC Take 1 tablet (20 mg total) by mouth 2 (two) times daily.   hydrochlorothiazide 25 MG tablet Commonly known as: HYDRODIURIL Take 1 tablet (25 mg total) by mouth daily.   metFORMIN 500 MG tablet Commonly known as: GLUCOPHAGE Take 1 tablet (500 mg total) by mouth 2 (two) times daily with a meal.   triamcinolone cream 0.1 % Commonly known as: KENALOG APPLY TO AFFECTED AREA TWICE A DAY.        Review of Systems  Constitutional:  Negative for activity change, fatigue and fever.       Weight loss #7-8Ibs in the past 6 months.   HENT:  Positive for hearing loss. Negative for congestion and voice change.   Eyes:  Negative for visual disturbance.  Respiratory:  Negative for shortness of breath.        Occasional hacking cough for 30 more years since started ACEI   Cardiovascular:  Positive for leg swelling. Negative for chest pain.  Gastrointestinal:  Negative for abdominal pain and constipation.  Genitourinary:  Negative for dysuria and urgency.       Pessary, nocturnal urination x1 average  Musculoskeletal:  Positive for arthralgias, back pain and gait problem.  Skin:  Positive for wound. Negative for color change.       Dry skin, itches sometimes with random small red papules.  Open wound R forearm  Neurological:  Negative for speech difficulty, weakness and headaches.  Psychiatric/Behavioral:  Positive for sleep disturbance. Negative for behavioral problems. The patient is not nervous/anxious.        Sometimes stay awake after bathroom, but taking a nap during day.     Immunization History  Administered Date(s) Administered  . Fluad Quad(high Dose 65+) 11/28/2020  . Influenza, High Dose Seasonal PF 11/27/2016, 11/14/2017, 11/25/2019, 12/12/2022  . Influenza, Seasonal, Injecte, Preservative Fre 12/27/2010, 12/21/2011  . Influenza,inj,Quad PF,6+ Mos 01/14/2013, 12/01/2013, 01/25/2015  . Influenza-Unspecified 11/12/2017  . Moderna SARS-COV2 Booster Vaccination 11/28/2022  . Moderna Sars-Covid-2 Vaccination 06/08/2019, 07/06/2019, 01/15/2020  . [employer], [employer] 91yrs & up 11/01/2020  . Pneumococcal Conjugate-13 07/27/2014  . Pneumococcal Polysaccharide-23 11/07/2004  . Tdap 02/20/2023   Pertinent  Health Maintenance Due  Topic Date Due  . FOOT EXAM  05/17/2023  . HEMOGLOBIN A1C  06/11/2023  . DEXA SCAN  Completed  . INFLUENZA VACCINE  Discontinued  . OPHTHALMOLOGY EXAM  Discontinued      12/07/2020    3:13 PM 05/17/2022    1:05 PM 06/07/2022    1:06 PM 09/20/2022   10:10 AM 02/21/2023    2:50 PM  Fall Risk  Falls in the past year? 0 0 0 0 1  Was there an injury with Fall? 0 0 0 0 1  Fall Risk Category Calculator 0 0 0 0 3  Fall Risk Category (Retired) Low      (RETIRED) Patient Fall Risk Level Low fall risk       Patient at Risk for Falls Due to No Fall Risks No Fall Risks No Fall Risks No Fall Risks   Fall risk Follow up  Falls evaluation completed Falls evaluation completed Falls evaluation completed    Functional Status Survey:    Vitals:   03/28/23 1659  BP:  122/78  Pulse: 65  Resp: 16  Temp: 97.6 F (36.4 C)  Weight: 126 lb 12.8 oz (57.5 kg)   Body mass index is 21.1 kg/m. Physical Exam Vitals and nursing note reviewed.  Constitutional:      Appearance: Normal appearance.  HENT:     Head: Normocephalic and atraumatic.     Mouth/Throat:     Mouth: Mucous membranes are moist.  Eyes:     Extraocular Movements: Extraocular movements intact.     Conjunctiva/sclera: Conjunctivae normal.     Pupils: Pupils are equal, round, and reactive to light.  Cardiovascular:     Rate and Rhythm: Normal rate and regular rhythm.     Heart sounds: No murmur heard.    Comments: Right radial pulse not felt.  Pulmonary:     Breath sounds: No rhonchi or rales.  Abdominal:     General: Bowel sounds are normal.     Palpations: Abdomen is soft.     Tenderness: There is no abdominal tenderness.  Musculoskeletal:     Cervical back: Normal range of motion and neck supple.     Right lower leg: Edema present.     Left lower leg: Edema present.     Comments: Trace edema in ankles  Skin:    General: Skin is warm and dry.     Comments: Right forearm skin tear, healing nicely.   Neurological:     General: No focal deficit present.     Mental Status: She is alert and oriented to person, place, and time. Mental status is at baseline.     Motor: No weakness.     Gait: Gait abnormal.     Comments: Resolved right arm/hand weakness, muscle strength 5/5, ambulates with walker.   Psychiatric:        Mood and Affect: Mood normal.        Behavior: Behavior normal.        Thought Content: Thought content normal.        Judgment: Judgment normal.    Labs reviewed: Recent Labs    05/15/22 0713  NA 139  K  3.9  CL 101  CO2 29  GLUCOSE 146*  BUN 21  CREATININE 0.99*  CALCIUM 9.9   Recent Labs    05/15/22 0713  AST 13  ALT 10  BILITOT 0.6  PROT 6.5   Recent Labs    12/11/22 0725  WBC 10.0  NEUTROABS 6,970  HGB 13.3  HCT 41.3  MCV 94.7  PLT 225   Lab Results  Component Value Date   TSH 3.60 11/28/2021   Lab Results  Component Value Date   HGBA1C 6.4 (H) 12/11/2022   Lab Results  Component Value Date   CHOL 129 12/11/2022   HDL 60 12/11/2022   LDLCALC 52 12/11/2022   TRIG 85 12/11/2022   CHOLHDL 2.2 12/11/2022    Significant Diagnostic Results in last 30 days:  No results found.  Assessment/Plan: Skin tear of forearm without complication f/u wound right forearm, healing nicely, fully treated with Doxycyline, superficial areas remained no s/s of infection.   HTN (hypertension) blood pressure is controlled, takes Atenolol, Enalapril, resumed HCTZ after stopped Jardiance.   CKD (chronic kidney disease) stage 3, GFR 30-59 ml/min (HCC) Bun/creat 21/0.99 05/15/22, off Jardiance 12/23 caused weakness.   CAD (coronary artery disease) Hx of heart attack, takes Lipitor, Enalapril, ASA  Hyperlipidemia on Atorvastatin, LDL 52 12/11/22    Family/ staff Communication: ***  Labs/tests ordered:  ***

## 2023-03-28 NOTE — Assessment & Plan Note (Signed)
on Metformin,  desires no routine ophthalmology, podiatrist visit. Hgb a1c 7.8 05/15/22>>6.4 12/11/22

## 2023-03-28 NOTE — Assessment & Plan Note (Signed)
blood pressure is controlled, takes Atenolol, Enalapril, resumed HCTZ after stopped Jardiance.

## 2023-03-28 NOTE — Assessment & Plan Note (Signed)
f/u wound right forearm, healing nicely, fully treated with Doxycyline, superficial areas remained no s/s of infection.

## 2023-03-28 NOTE — Assessment & Plan Note (Signed)
Bun/creat 21/0.99 05/15/22, off Jardiance 12/23 caused weakness.

## 2023-03-28 NOTE — Assessment & Plan Note (Signed)
Hx of heart attack, takes Lipitor, Enalapril, ASA

## 2023-03-29 ENCOUNTER — Encounter: Payer: Self-pay | Admitting: Nurse Practitioner

## 2023-06-24 ENCOUNTER — Ambulatory Visit: Payer: PPO | Admitting: Obstetrics and Gynecology

## 2023-07-02 ENCOUNTER — Other Ambulatory Visit: Payer: PPO

## 2023-07-02 DIAGNOSIS — E782 Mixed hyperlipidemia: Secondary | ICD-10-CM | POA: Diagnosis not present

## 2023-07-02 DIAGNOSIS — E1100 Type 2 diabetes mellitus with hyperosmolarity without nonketotic hyperglycemic-hyperosmolar coma (NKHHC): Secondary | ICD-10-CM | POA: Diagnosis not present

## 2023-07-02 DIAGNOSIS — N1831 Chronic kidney disease, stage 3a: Secondary | ICD-10-CM | POA: Diagnosis not present

## 2023-07-02 DIAGNOSIS — R634 Abnormal weight loss: Secondary | ICD-10-CM | POA: Diagnosis not present

## 2023-07-05 ENCOUNTER — Ambulatory Visit: Payer: Self-pay | Admitting: Nurse Practitioner

## 2023-07-06 LAB — VITAMIN D 1,25 DIHYDROXY
Vitamin D 1, 25 (OH)2 Total: 23 pg/mL (ref 18–72)
Vitamin D2 1, 25 (OH)2: <8 pg/mL
Vitamin D3 1, 25 (OH)2: 23 pg/mL

## 2023-07-06 LAB — COMPREHENSIVE METABOLIC PANEL WITH GFR
AG Ratio: 1.5 (calc) (ref 1.0–2.5)
ALT: 12 U/L (ref 6–29)
AST: 15 U/L (ref 10–35)
Albumin: 4.2 g/dL (ref 3.6–5.1)
Alkaline phosphatase (APISO): 73 U/L (ref 37–153)
BUN: 17 mg/dL (ref 7–25)
CO2: 30 mmol/L (ref 20–32)
Calcium: 10 mg/dL (ref 8.6–10.4)
Chloride: 94 mmol/L — ABNORMAL LOW (ref 98–110)
Creat: 0.83 mg/dL (ref 0.60–0.95)
Globulin: 2.8 g/dL (ref 1.9–3.7)
Glucose, Bld: 132 mg/dL — ABNORMAL HIGH (ref 65–99)
Potassium: 4 mmol/L (ref 3.5–5.3)
Sodium: 134 mmol/L — ABNORMAL LOW (ref 135–146)
Total Bilirubin: 1 mg/dL (ref 0.2–1.2)
Total Protein: 7 g/dL (ref 6.1–8.1)
eGFR: 64 mL/min/{1.73_m2} (ref >=60)

## 2023-07-06 LAB — TSH: TSH: 2.79 m[IU]/L (ref 0.40–4.50)

## 2023-07-06 LAB — HEMOGLOBIN A1C
Hgb A1c MFr Bld: 6.8 % — ABNORMAL HIGH (ref <5.7)
Mean Plasma Glucose: 148 mg/dL
eAG (mmol/L): 8.2 mmol/L

## 2023-07-06 LAB — CBC WITH DIFFERENTIAL/PLATELET
Absolute Lymphocytes: 1984 {cells}/uL (ref 850–3900)
Absolute Monocytes: 532 {cells}/uL (ref 200–950)
Basophils Absolute: 53 {cells}/uL (ref 0–200)
Basophils Relative: 0.7 %
Eosinophils Absolute: 258 {cells}/uL (ref 15–500)
Eosinophils Relative: 3.4 %
HCT: 44.3 % (ref 35.0–45.0)
Hemoglobin: 14.7 g/dL (ref 11.7–15.5)
MCH: 29.9 pg (ref 27.0–33.0)
MCHC: 33.2 g/dL (ref 32.0–36.0)
MCV: 90 fL (ref 80.0–100.0)
MPV: 10.6 fL (ref 7.5–12.5)
Monocytes Relative: 7 %
Neutro Abs: 4773 {cells}/uL (ref 1500–7800)
Neutrophils Relative %: 62.8 %
Platelets: 205 10*3/uL (ref 140–400)
RBC: 4.92 10*6/uL (ref 3.80–5.10)
RDW: 13 % (ref 11.0–15.0)
Total Lymphocyte: 26.1 %
WBC: 7.6 10*3/uL (ref 3.8–10.8)

## 2023-07-06 LAB — LIPID PANEL
Cholesterol: 141 mg/dL (ref <200)
HDL: 68 mg/dL (ref >=50)
LDL Cholesterol (Calc): 54 mg/dL
Non-HDL Cholesterol (Calc): 73 mg/dL (ref <130)
Total CHOL/HDL Ratio: 2.1 (calc) (ref <5.0)
Triglycerides: 102 mg/dL (ref <150)

## 2023-07-06 LAB — VITAMIN B12: Vitamin B-12: 392 pg/mL (ref 200–1100)

## 2023-07-11 ENCOUNTER — Non-Acute Institutional Stay: Payer: PPO | Admitting: Nurse Practitioner

## 2023-07-11 ENCOUNTER — Encounter: Payer: Self-pay | Admitting: Nurse Practitioner

## 2023-07-11 VITALS — BP 118/70 | HR 69 | Temp 97.3°F | Ht 65.0 in | Wt 131.0 lb

## 2023-07-11 DIAGNOSIS — E1100 Type 2 diabetes mellitus with hyperosmolarity without nonketotic hyperglycemic-hyperosmolar coma (NKHHC): Secondary | ICD-10-CM

## 2023-07-11 DIAGNOSIS — E559 Vitamin D deficiency, unspecified: Secondary | ICD-10-CM | POA: Diagnosis not present

## 2023-07-11 DIAGNOSIS — N1831 Chronic kidney disease, stage 3a: Secondary | ICD-10-CM | POA: Diagnosis not present

## 2023-07-11 DIAGNOSIS — R634 Abnormal weight loss: Secondary | ICD-10-CM | POA: Diagnosis not present

## 2023-07-11 DIAGNOSIS — I10 Essential (primary) hypertension: Secondary | ICD-10-CM | POA: Diagnosis not present

## 2023-07-11 DIAGNOSIS — E871 Hypo-osmolality and hyponatremia: Secondary | ICD-10-CM

## 2023-07-11 DIAGNOSIS — E782 Mixed hyperlipidemia: Secondary | ICD-10-CM

## 2023-07-11 DIAGNOSIS — I2510 Atherosclerotic heart disease of native coronary artery without angina pectoris: Secondary | ICD-10-CM

## 2023-07-11 NOTE — Assessment & Plan Note (Signed)
 blood pressure is controlled, takes Atenolol, Enalapril, resumed HCTZ after stopped Jardiance.

## 2023-07-11 NOTE — Assessment & Plan Note (Signed)
 Vit D 23, on Ca, Vit D, encourage sun light exposures and increase Vit D supplement up to 2000u daily.

## 2023-07-11 NOTE — Progress Notes (Signed)
 Location:   [facility]   Place of Service:  Clinic (12) Provider: Kerman Peck NP  Code Status: DNR Goals of Care:     03/07/2023    9:01 AM  Advanced Directives  Does Patient Have a Medical Advance Directive? Yes  Type of Advance Directive Healthcare Power of Attorney  Does patient want to make changes to medical advance directive? No - Patient declined  Copy of Healthcare Power of Attorney in Chart? No - copy requested     Chief Complaint  Patient presents with   Medical Management of Chronic Issues    6 month follow up with foot exam. Discussed need for AWV(declined). Patient c/o issues with right arm    HPI: Patient is a 89 y.o. female seen today managing chronic medical conditions.    HTN, blood pressure is controlled, takes Atenolol , Enalapril , resumed HCTZ after stopped Jardiance .   Hyponatremia, mild, Na 134 07/02/23, hydrochlorothiazide  can be contributing.              CKD Bun/creat 17/0.83 07/02/23,  off Jardiance  12/23 caused weakness.              CAD, Hx of heart attack, takes Lipitor, Enalapril , ASA             Hyperlipidemia, on Atorvastatin , LDL 54 07/02/23             T2DM, on Metformin ,  desires no routine ophthalmology, podiatrist visit. Hgb a1c 6.8 07/02/23             Pessary, followed by GYN, saw Urology in the past             Weight loss: stable, Vit B12 392, TSH 2.79 07/02/23  Vit D deficiency, Vit D 23, on Ca, Vit D  Hx of vasculitis RUE, treated with Methotrexate, no radial pulse R  Past Medical History:  Diagnosis Date   Coronary arteriosclerosis    Diabetes mellitus without complication (HCC)    Type 2    Hyperlipidemia    Hypertension    Myocardial infarction [facility])     Past Surgical History:  Procedure Laterality Date   APPENDECTOMY  1988   Dr. Donata Fryer   CORONARY ARTERY BYPASS GRAFT  1990   EYE SURGERY  2012   Dr, Althea Atkinson   KYPHOPLASTY  2015   Dr. Ozell Blunt    Allergies  Allergen Reactions   Bactrim  [Sulfamethoxazole -Trimethoprim ]  Diarrhea   Shrimp [Shellfish Allergy]    Poison Ivy Extract Rash    Allergies as of 07/11/2023       Reactions   Bactrim  [sulfamethoxazole -trimethoprim ] Diarrhea   Shrimp [shellfish Allergy]    Poison Ivy Extract Rash        Medication List        Accurate as of Jul 11, 2023  1:44 PM. If you have any questions, ask your nurse or doctor.          atenolol  50 MG tablet Commonly known as: TENORMIN  Take 1 tablet (50 mg total) by mouth daily.   atorvastatin  20 MG tablet Commonly known as: LIPITOR Take 1 tablet (20 mg total) by mouth daily.   Calcium  Carbonate-Vitamin D3 600-400 MG-UNIT Tabs Take 600 mg by mouth 2 (two) times daily with a meal.   enalapril  20 MG tablet Commonly known as: VASOTEC  Take 1 tablet (20 mg total) by mouth 2 (two) times daily.   hydrochlorothiazide  25 MG tablet Commonly known as: HYDRODIURIL  Take 1 tablet (25 mg total) by mouth daily.  metFORMIN  500 MG tablet Commonly known as: GLUCOPHAGE  Take 1 tablet (500 mg total) by mouth 2 (two) times daily with a meal.   triamcinolone  cream 0.1 % Commonly known as: KENALOG  APPLY TO AFFECTED AREA TWICE A DAY.        Review of Systems:  Review of Systems  Constitutional:  Negative for activity change, fatigue and fever.  HENT:  Positive for hearing loss. Negative for congestion and voice change.   Eyes:  Negative for visual disturbance.  Respiratory:  Negative for shortness of breath.        Occasional hacking cough for 30 more years since started ACEI  Cardiovascular:  Negative for chest pain and leg swelling.  Gastrointestinal:  Negative for abdominal pain and constipation.  Genitourinary:  Negative for dysuria and urgency.       Pessary, nocturnal urination x1 average  Musculoskeletal:  Positive for arthralgias, back pain and gait problem.  Skin:  Negative for color change.       Dry skin, itches sometimes with random small red papules  Neurological:  Negative for speech difficulty,  weakness and headaches.  Psychiatric/Behavioral:  Positive for sleep disturbance. Negative for behavioral problems. The patient is not nervous/anxious.        Sometimes stay awake after bathroom trips at night, but taking a nap during day.     Health Maintenance  Topic Date Due   FOOT EXAM  05/17/2023   Medicare Annual Wellness (AWV)  08/11/2023 (Originally 06/07/2023)   COVID-19 Vaccine (6 - 2024-25 season) 12/12/2023 (Originally 01/23/2023)   Zoster Vaccines- Shingrix (1 of 2) 03/03/2024 (Originally 07/09/1973)   HEMOGLOBIN A1C  01/02/2024   Pneumonia Vaccine 83+ Years old  Completed   DEXA SCAN  Completed   HPV VACCINES  Aged Out   Meningococcal B Vaccine  Aged Out   DTaP/Tdap/Td  Discontinued   INFLUENZA VACCINE  Discontinued   OPHTHALMOLOGY EXAM  Discontinued    Physical Exam: Vitals:   07/11/23 0920  BP: 118/70  Pulse: 69  Temp: (!) 97.3 F (36.3 C)  SpO2: 98%  Weight: 131 lb (59.4 kg)  Height: 5\' 5"  (1.651 m)   Body mass index is 21.8 kg/m. Physical Exam Vitals and nursing note reviewed.  Constitutional:      Appearance: Normal appearance.  HENT:     Head: Normocephalic and atraumatic.     Mouth/Throat:     Mouth: Mucous membranes are moist.  Eyes:     Extraocular Movements: Extraocular movements intact.     Conjunctiva/sclera: Conjunctivae normal.     Pupils: Pupils are equal, round, and reactive to light.  Cardiovascular:     Rate and Rhythm: Normal rate and regular rhythm.     Heart sounds: No murmur heard.    Comments: Right radial pulse not felt.  Pulmonary:     Breath sounds: No rhonchi or rales.  Abdominal:     General: Bowel sounds are normal.     Palpations: Abdomen is soft.     Tenderness: There is no abdominal tenderness.  Musculoskeletal:     Cervical back: Normal range of motion and neck supple.     Right lower leg: No edema.     Left lower leg: No edema.  Skin:    General: Skin is warm and dry.  Neurological:     General: No focal  deficit present.     Mental Status: She is alert and oriented to person, place, and time. Mental status is at baseline.  Motor: No weakness.     Gait: Gait abnormal.     Comments: Resolved right arm/hand weakness, muscle strength 5/5, ambulates with walker.   Psychiatric:        Mood and Affect: Mood normal.        Behavior: Behavior normal.        Thought Content: Thought content normal.        Judgment: Judgment normal.     Labs reviewed: Basic Metabolic Panel: Recent Labs    07/02/23 0650  NA 134*  K 4.0  CL 94*  CO2 30  GLUCOSE 132*  BUN 17  CREATININE 0.83  CALCIUM  10.0  TSH 2.79   Liver Function Tests: Recent Labs    07/02/23 0650  AST 15  ALT 12  BILITOT 1.0  PROT 7.0   No results for input(s): "LIPASE", "AMYLASE" in the last 8760 hours. No results for input(s): "AMMONIA" in the last 8760 hours. CBC: Recent Labs    12/11/22 0725 07/02/23 0650  WBC 10.0 7.6  NEUTROABS 6,970 4,773  HGB 13.3 14.7  HCT 41.3 44.3  MCV 94.7 90.0  PLT 225 205   Lipid Panel: Recent Labs    12/11/22 0725 07/02/23 0650  CHOL 129 141  HDL 60 68  LDLCALC 52 54  TRIG 85 102  CHOLHDL 2.2 2.1   Lab Results  Component Value Date   HGBA1C 6.8 (H) 07/02/2023    Procedures since last visit: DG Forearm Right Result Date: 02/20/2023 CLINICAL DATA:  Patient fell this morning in bathroom and hit arm on laundry hamper. Skin tear posterior surface forearm. EXAM: RIGHT FOREARM - 2 VIEW COMPARISON:  None Available. FINDINGS: Demineralization. No acute fracture or dislocation. Chronic ulnar styloid fracture. Degenerative arthritis about the wrist. Soft tissue laceration about the posterior mid forearm. IMPRESSION: No acute fracture or dislocation. Electronically Signed   By: Rozell Cornet M.D.   On: 02/20/2023 02:28    Assessment/Plan HTN (hypertension) blood pressure is controlled, takes Atenolol , Enalapril , resumed HCTZ after stopped Jardiance .   CKD (chronic kidney  disease) stage 3, GFR 30-59 ml/min (HCC) Bun/creat 17/0.83 07/02/23,  off Jardiance  12/23 caused weakness.   CAD (coronary artery disease) Hx of heart attack, takes Lipitor, Enalapril , ASA  Hyperlipidemia  on Atorvastatin , LDL 54 07/02/23  Type 2 diabetes mellitus (HCC)  on Metformin ,  desires no routine ophthalmology, podiatrist visit. Hgb a1c 6.8 07/02/23  Cystocele, unspecified (CODE) followed by GYN, saw Urology in the past  Weight loss stable, Vit B12 392, TSH 2.79 07/02/23  Vitamin D deficiency Vit D 23, on Ca, Vit D, encourage sun light exposures and increase Vit D supplement up to 2000u daily.   Hyponatremia  mild, Na 134 07/02/23, hydrochlorothiazide  can be contributing.     Labs/tests ordered:  labs one week prior to the appts: CBC/diff, CMP/eGFR, TSH, Hgb A1c, lipids, Vit D, Vit B12, Hgb a1c  Next appt:  5 months.

## 2023-07-11 NOTE — Assessment & Plan Note (Signed)
 on Atorvastatin , LDL 54 07/02/23

## 2023-07-11 NOTE — Assessment & Plan Note (Signed)
 followed by GYN, saw Urology in the past

## 2023-07-11 NOTE — Assessment & Plan Note (Signed)
 Hx of heart attack, takes Lipitor, Enalapril, ASA

## 2023-07-11 NOTE — Assessment & Plan Note (Addendum)
 on Metformin ,  desires no routine ophthalmology, podiatrist visit. Hgb a1c 6.8 07/02/23 CBC/diff, CMP/eGFR, TSH, Hgb A1c, lipids, Vit D, Vit B12, Hgb A1c prior the next appts.

## 2023-07-11 NOTE — Assessment & Plan Note (Signed)
 mild, Na 134 07/02/23, hydrochlorothiazide  can be contributing.

## 2023-07-11 NOTE — Patient Instructions (Signed)
 Please come to [facility] on December 12, 2023 @ 7 am for fasting labs

## 2023-07-11 NOTE — Assessment & Plan Note (Signed)
 Bun/creat 17/0.83 07/02/23,  off Jardiance  12/23 caused weakness.

## 2023-07-11 NOTE — Assessment & Plan Note (Signed)
 stable, Vit B12 392, TSH 2.79 07/02/23

## 2023-09-05 ENCOUNTER — Encounter: Payer: Self-pay | Admitting: Nurse Practitioner

## 2023-09-05 ENCOUNTER — Ambulatory Visit: Admitting: Nurse Practitioner

## 2023-09-05 VITALS — BP 128/68 | HR 79 | Temp 97.2°F | Resp 18 | Ht 65.0 in | Wt 130.0 lb

## 2023-09-05 DIAGNOSIS — S81812D Laceration without foreign body, left lower leg, subsequent encounter: Secondary | ICD-10-CM | POA: Diagnosis not present

## 2023-09-05 DIAGNOSIS — N1831 Chronic kidney disease, stage 3a: Secondary | ICD-10-CM

## 2023-09-05 DIAGNOSIS — I10 Essential (primary) hypertension: Secondary | ICD-10-CM

## 2023-09-05 DIAGNOSIS — I2510 Atherosclerotic heart disease of native coronary artery without angina pectoris: Secondary | ICD-10-CM

## 2023-09-05 DIAGNOSIS — E782 Mixed hyperlipidemia: Secondary | ICD-10-CM | POA: Diagnosis not present

## 2023-09-05 DIAGNOSIS — E871 Hypo-osmolality and hyponatremia: Secondary | ICD-10-CM | POA: Diagnosis not present

## 2023-09-05 NOTE — Progress Notes (Unsigned)
 Location:  Clinic [facility]   Place of Service:    Provider: [facility] Gandolfo NP  Delisha Peaden X, NP  Patient Care Team: Quintel Mccalla X, NP as PCP - General (Internal Medicine) Seairra Otani X, NP as Nurse Practitioner (Internal Medicine)  Extended Emergency Contact Information Primary Emergency Contact: Phillips,Hal Address: [address]          [place], [place] [address] [place] Home Phone: [phone] Relation: Son  Code Status:  DNR Goals of care: Advanced Directive information    03/07/2023    9:01 AM  Advanced Directives  Does Patient Have a Medical Advance Directive? Yes  Type of Advance Directive Healthcare Power of Attorney  Does patient want to make changes to medical advance directive? No - Patient declined  Copy of Healthcare Power of Attorney in Chart? No - copy requested     Chief Complaint  Patient presents with   Leg Injury    Patient hit her leg, Patient stated that their is a laceration on her ankle and that she needs Lister Brizzi X, NP to place an order for the nurse at [facility] to keep wrapping her ankle.     HPI:  Pt is a 89 y.o. female seen today for skin tear left lower leg, steri strips closure applied, no active bleeding or s/s of infection.   HTN, blood pressure is controlled, takes Atenolol , Enalapril , resumed HCTZ after stopped Jardiance .              Hyponatremia, mild, Na 134 07/02/23, hydrochlorothiazide  can be contributing.              CKD Bun/creat 17/0.83 07/02/23,  off Jardiance  12/23 caused weakness.              CAD, Hx of heart attack, takes Lipitor, Enalapril , ASA             Hyperlipidemia, on Atorvastatin , LDL 54 07/02/23             T2DM, on Metformin ,  desires no routine ophthalmology, podiatrist visit. Hgb a1c 6.8 07/02/23             Pessary, followed by GYN, saw Urology in the past             Weight loss: stable, Vit B12 392, TSH 2.79 07/02/23             Vit D deficiency, Vit D 23, on Ca, Vit D             Hx of vasculitis  RUE, treated with Methotrexate, no radial pulse R  Itching red papules on and off for years, Triamcinolone  cream effective.   Past Medical History:  Diagnosis Date   Coronary arteriosclerosis    Diabetes mellitus without complication (HCC)    Type 2    Hyperlipidemia    Hypertension    Myocardial infarction [facility])    Past Surgical History:  Procedure Laterality Date   APPENDECTOMY  1988   Dr. Maude   CORONARY ARTERY BYPASS GRAFT  1990   EYE SURGERY  2012   Dr, Ashley   KYPHOPLASTY  2015   Dr. Renato    Allergies  Allergen Reactions   Bactrim  [Sulfamethoxazole -Trimethoprim ] Diarrhea   Shrimp [Shellfish Allergy]    Poison Ivy Extract Rash    Allergies as of 09/05/2023       Reactions   Bactrim  [sulfamethoxazole -trimethoprim ] Diarrhea   Shrimp [shellfish Allergy]    Poison Ivy Extract Rash  Medication List        Accurate as of September 05, 2023 11:59 PM. If you have any questions, ask your nurse or doctor.          atenolol  50 MG tablet Commonly known as: TENORMIN  Take 1 tablet (50 mg total) by mouth daily.   atorvastatin  20 MG tablet Commonly known as: LIPITOR Take 1 tablet (20 mg total) by mouth daily.   Calcium  Carbonate-Vitamin D3 600-400 MG-UNIT Tabs Take 600 mg by mouth 2 (two) times daily with a meal.   enalapril  20 MG tablet Commonly known as: VASOTEC  Take 1 tablet (20 mg total) by mouth 2 (two) times daily.   hydrochlorothiazide  25 MG tablet Commonly known as: HYDRODIURIL  Take 1 tablet (25 mg total) by mouth daily.   metFORMIN  500 MG tablet Commonly known as: GLUCOPHAGE  Take 1 tablet (500 mg total) by mouth 2 (two) times daily with a meal.   triamcinolone  cream 0.1 % Commonly known as: KENALOG  APPLY TO AFFECTED AREA TWICE A DAY.        Review of Systems  Constitutional:  Negative for activity change, fatigue and fever.  HENT:  Positive for hearing loss. Negative for congestion and voice change.   Eyes:  Negative for visual  disturbance.  Respiratory:  Negative for shortness of breath.        Occasional hacking cough for 30 more years since started ACEI  Cardiovascular:  Negative for chest pain and leg swelling.  Gastrointestinal:  Negative for abdominal pain and constipation.  Genitourinary:  Negative for dysuria and urgency.       Pessary, nocturnal urination x1 average  Musculoskeletal:  Positive for arthralgias, back pain and gait problem.  Skin:  Negative for color change.       Dry skin, itches sometimes with random small red papules Left lower leg skin tear in a triangle shape, no active bleeding or s/s of infection, intact steri strips closure.   Neurological:  Negative for speech difficulty, weakness and headaches.  Psychiatric/Behavioral:  Positive for sleep disturbance. Negative for behavioral problems. The patient is not nervous/anxious.        Sometimes stay awake after bathroom trips at night, but taking a nap during day.     Immunization History  Administered Date(s) Administered   Fluad Quad(high Dose 65+) 11/28/2020   Influenza, High Dose Seasonal PF 11/27/2016, 11/14/2017, 11/25/2019, 12/12/2022   Influenza, Seasonal, Injecte, Preservative Fre 12/27/2010, 12/21/2011   Influenza,inj,Quad PF,6+ Mos 01/14/2013, 12/01/2013, 01/25/2015   Influenza-Unspecified 11/12/2017   Moderna SARS-COV2 Booster Vaccination 11/28/2022   Moderna Sars-Covid-2 Vaccination 06/08/2019, 07/06/2019, 01/15/2020   Pfizer Covid-19 Vaccine Bivalent Booster 16yrs & up 11/01/2020   Pneumococcal Conjugate-13 07/27/2014   Pneumococcal Polysaccharide-23 11/07/2004   Tdap 02/20/2023   Pertinent  Health Maintenance Due  Topic Date Due   FOOT EXAM  05/17/2023   HEMOGLOBIN A1C  01/02/2024   DEXA SCAN  Completed   INFLUENZA VACCINE  Discontinued   OPHTHALMOLOGY EXAM  Discontinued      12/07/2020    3:13 PM 05/17/2022    1:05 PM 06/07/2022    1:06 PM 09/20/2022   10:10 AM 02/21/2023    2:50 PM  Fall Risk  Falls in the  past year? 0 0 0 0 1  Was there an injury with Fall? 0 0 0 0 1  Fall Risk Category Calculator 0 0 0 0 3  Fall Risk Category (Retired) Low       (RETIRED) Patient Fall Risk Level Low  fall risk       Patient at Risk for Falls Due to No Fall Risks No Fall Risks No Fall Risks No Fall Risks   Fall risk Follow up  Falls evaluation completed Falls evaluation completed Falls evaluation completed      Data saved with a previous flowsheet row definition   Functional Status Survey:    Vitals:   09/05/23 1521  BP: 128/68  Pulse: 79  Resp: 18  Temp: (!) 97.2 F (36.2 C)  SpO2: 97%  Weight: 130 lb (59 kg)  Height: 5' 5 (1.651 m)   Body mass index is 21.63 kg/m. Physical Exam Vitals and nursing note reviewed.  Constitutional:      Appearance: Normal appearance.  HENT:     Head: Normocephalic and atraumatic.     Mouth/Throat:     Mouth: Mucous membranes are moist.  Eyes:     Extraocular Movements: Extraocular movements intact.     Conjunctiva/sclera: Conjunctivae normal.     Pupils: Pupils are equal, round, and reactive to light.  Cardiovascular:     Rate and Rhythm: Normal rate and regular rhythm.     Heart sounds: No murmur heard.    Comments: Right radial pulse not felt.  Pulmonary:     Breath sounds: No rhonchi or rales.  Abdominal:     General: Bowel sounds are normal.     Palpations: Abdomen is soft.     Tenderness: There is no abdominal tenderness.  Musculoskeletal:     Cervical back: Normal range of motion and neck supple.     Right lower leg: No edema.     Left lower leg: No edema.  Skin:    General: Skin is warm and dry.     Comments: Left lower leg skin tear in a triangle shape, no active bleeding or s/s of infection, intact steri strips closure.  On and off itching red papules, chronic, responded to topical steroid, represent papular eczema  Neurological:     General: No focal deficit present.     Mental Status: She is alert and oriented to person, place, and  time. Mental status is at baseline.     Motor: No weakness.     Gait: Gait abnormal.     Comments: Resolved right arm/hand weakness, muscle strength 5/5, ambulates with walker.   Psychiatric:        Mood and Affect: Mood normal.        Behavior: Behavior normal.        Thought Content: Thought content normal.        Judgment: Judgment normal.     Labs reviewed: Recent Labs    07/02/23 0650  NA 134*  K 4.0  CL 94*  CO2 30  GLUCOSE 132*  BUN 17  CREATININE 0.83  CALCIUM  10.0   Recent Labs    07/02/23 0650  AST 15  ALT 12  BILITOT 1.0  PROT 7.0   Recent Labs    12/11/22 0725 07/02/23 0650  WBC 10.0 7.6  NEUTROABS 6,970 4,773  HGB 13.3 14.7  HCT 41.3 44.3  MCV 94.7 90.0  PLT 225 205   Lab Results  Component Value Date   TSH 2.79 07/02/2023   Lab Results  Component Value Date   HGBA1C 6.8 (H) 07/02/2023   Lab Results  Component Value Date   CHOL 141 07/02/2023   HDL 68 07/02/2023   LDLCALC 54 07/02/2023   TRIG 102 07/02/2023   CHOLHDL 2.1 07/02/2023  Significant Diagnostic Results in last 30 days:  No results found.  Assessment/Plan  HTN (hypertension) blood pressure is controlled, takes Atenolol , Enalapril , resumed HCTZ after stopped Jardiance   Hyponatremia mild, Na 134 07/02/23, hydrochlorothiazide  can be contributing.   CKD (chronic kidney disease) stage 3, GFR 30-59 ml/min (HCC) Bun/creat 17/0.83 07/02/23,  off Jardiance  12/23 caused weakness.   CAD (coronary artery disease) No chest pain reported, Hx of heart attack, takes Lipitor, Enalapril , ASA  Hyperlipidemia on Atorvastatin , LDL 54 07/02/23  Type 2 diabetes mellitus (HCC)  on Metformin ,  desires no routine ophthalmology, podiatrist visit. Hgb a1c 6.8 07/02/23  Skin tear of lower leg without complication  skin tear left lower leg, steri strips closure applied, no active bleeding or s/s of infection.  Apply dry dressing daily   Family/ staff Communication: plan of care reviewed  with the patient and charge nurse.   Labs/tests ordered:  none

## 2023-09-05 NOTE — Assessment & Plan Note (Signed)
 skin tear left lower leg, steri strips closure applied, no active bleeding or s/s of infection.  Apply dry dressing daily

## 2023-09-05 NOTE — Assessment & Plan Note (Signed)
 on Metformin ,  desires no routine ophthalmology, podiatrist visit. Hgb a1c 6.8 07/02/23

## 2023-09-05 NOTE — Assessment & Plan Note (Signed)
 on Atorvastatin , LDL 54 07/02/23

## 2023-09-05 NOTE — Assessment & Plan Note (Signed)
 mild, Na 134 07/02/23, hydrochlorothiazide  can be contributing.

## 2023-09-05 NOTE — Assessment & Plan Note (Signed)
 Bun/creat 17/0.83 07/02/23,  off Jardiance  12/23 caused weakness.

## 2023-09-05 NOTE — Assessment & Plan Note (Signed)
 No chest pain reported, Hx of heart attack, takes Lipitor, Enalapril , ASA

## 2023-09-05 NOTE — Assessment & Plan Note (Signed)
 blood pressure is controlled, takes Atenolol, Enalapril, resumed HCTZ after stopped Jardiance.

## 2023-09-06 ENCOUNTER — Encounter: Payer: Self-pay | Admitting: Nurse Practitioner

## 2023-09-30 ENCOUNTER — Other Ambulatory Visit: Payer: Self-pay | Admitting: Nurse Practitioner

## 2023-09-30 DIAGNOSIS — L239 Allergic contact dermatitis, unspecified cause: Secondary | ICD-10-CM

## 2023-12-03 ENCOUNTER — Other Ambulatory Visit: Payer: Self-pay | Admitting: Nurse Practitioner

## 2023-12-03 DIAGNOSIS — E1100 Type 2 diabetes mellitus with hyperosmolarity without nonketotic hyperglycemic-hyperosmolar coma (NKHHC): Secondary | ICD-10-CM

## 2023-12-12 DIAGNOSIS — R634 Abnormal weight loss: Secondary | ICD-10-CM | POA: Diagnosis not present

## 2023-12-12 DIAGNOSIS — E559 Vitamin D deficiency, unspecified: Secondary | ICD-10-CM | POA: Diagnosis not present

## 2023-12-12 DIAGNOSIS — N1831 Chronic kidney disease, stage 3a: Secondary | ICD-10-CM | POA: Diagnosis not present

## 2023-12-12 DIAGNOSIS — E782 Mixed hyperlipidemia: Secondary | ICD-10-CM | POA: Diagnosis not present

## 2023-12-16 LAB — COMPLETE METABOLIC PANEL WITHOUT GFR
AG Ratio: 2.1 (calc) (ref 1.0–2.5)
ALT: 13 U/L (ref 6–29)
AST: 15 U/L (ref 10–35)
Albumin: 4.5 g/dL (ref 3.6–5.1)
Alkaline phosphatase (APISO): 71 U/L (ref 37–153)
BUN: 21 mg/dL (ref 7–25)
CO2: 31 mmol/L (ref 20–32)
Calcium: 10.2 mg/dL (ref 8.6–10.4)
Chloride: 98 mmol/L (ref 98–110)
Creat: 0.87 mg/dL (ref 0.60–0.95)
Globulin: 2.1 g/dL (ref 1.9–3.7)
Glucose, Bld: 168 mg/dL — ABNORMAL HIGH (ref 65–99)
Potassium: 4 mmol/L (ref 3.5–5.3)
Sodium: 136 mmol/L (ref 135–146)
Total Bilirubin: 0.9 mg/dL (ref 0.2–1.2)
Total Protein: 6.6 g/dL (ref 6.1–8.1)

## 2023-12-16 LAB — VITAMIN D 1,25 DIHYDROXY
Vitamin D 1, 25 (OH)2 Total: 23 pg/mL (ref 18–72)
Vitamin D2 1, 25 (OH)2: <8 pg/mL
Vitamin D3 1, 25 (OH)2: 23 pg/mL

## 2023-12-16 LAB — CBC WITH DIFFERENTIAL/PLATELET
Absolute Lymphocytes: 1495 {cells}/uL (ref 850–3900)
Absolute Monocytes: 363 {cells}/uL (ref 200–950)
Basophils Absolute: 37 {cells}/uL (ref 0–200)
Basophils Relative: 0.5 %
Eosinophils Absolute: 81 {cells}/uL (ref 15–500)
Eosinophils Relative: 1.1 %
HCT: 46.4 % — ABNORMAL HIGH (ref 35.0–45.0)
Hemoglobin: 15.3 g/dL (ref 11.7–15.5)
MCH: 30.9 pg (ref 27.0–33.0)
MCHC: 33 g/dL (ref 32.0–36.0)
MCV: 93.7 fL (ref 80.0–100.0)
MPV: 10.7 fL (ref 7.5–12.5)
Monocytes Relative: 4.9 %
Neutro Abs: 5424 {cells}/uL (ref 1500–7800)
Neutrophils Relative %: 73.3 %
Platelets: 214 Thousand/uL (ref 140–400)
RBC: 4.95 Million/uL (ref 3.80–5.10)
RDW: 12.1 % (ref 11.0–15.0)
Total Lymphocyte: 20.2 %
WBC: 7.4 Thousand/uL (ref 3.8–10.8)

## 2023-12-16 LAB — TSH: TSH: 2.48 m[IU]/L (ref 0.40–4.50)

## 2023-12-16 LAB — LIPID PANEL
Cholesterol: 120 mg/dL (ref <200)
HDL: 65 mg/dL (ref >=50)
LDL Cholesterol (Calc): 41 mg/dL
Non-HDL Cholesterol (Calc): 55 mg/dL (ref <130)
Total CHOL/HDL Ratio: 1.8 (calc) (ref <5.0)
Triglycerides: 68 mg/dL (ref <150)

## 2023-12-16 LAB — HEMOGLOBIN A1C
Hgb A1c MFr Bld: 6.8 % — ABNORMAL HIGH (ref <5.7)
Mean Plasma Glucose: 148 mg/dL
eAG (mmol/L): 8.2 mmol/L

## 2023-12-16 LAB — VITAMIN B12: Vitamin B-12: 328 pg/mL (ref 200–1100)

## 2023-12-19 ENCOUNTER — Non-Acute Institutional Stay: Payer: Self-pay | Admitting: Nurse Practitioner

## 2023-12-19 ENCOUNTER — Encounter: Payer: Self-pay | Admitting: Nurse Practitioner

## 2023-12-19 VITALS — BP 132/72 | HR 74 | Temp 97.8°F | Resp 18 | Ht 65.0 in | Wt 136.6 lb

## 2023-12-19 DIAGNOSIS — E559 Vitamin D deficiency, unspecified: Secondary | ICD-10-CM

## 2023-12-19 DIAGNOSIS — E1100 Type 2 diabetes mellitus with hyperosmolarity without nonketotic hyperglycemic-hyperosmolar coma (NKHHC): Secondary | ICD-10-CM

## 2023-12-19 DIAGNOSIS — Z7984 Long term (current) use of oral hypoglycemic drugs: Secondary | ICD-10-CM | POA: Diagnosis not present

## 2023-12-19 DIAGNOSIS — N1831 Chronic kidney disease, stage 3a: Secondary | ICD-10-CM | POA: Diagnosis not present

## 2023-12-19 DIAGNOSIS — I10 Essential (primary) hypertension: Secondary | ICD-10-CM

## 2023-12-19 DIAGNOSIS — E871 Hypo-osmolality and hyponatremia: Secondary | ICD-10-CM

## 2023-12-19 NOTE — Assessment & Plan Note (Signed)
 mild, Na 134 07/02/23<<136 12/12/23, hydrochlorothiazide  can be contributing.

## 2023-12-19 NOTE — Assessment & Plan Note (Signed)
 on Metformin ,  desires no routine ophthalmology, podiatrist visit. Hgb a1c 6.8 07/02/23=6.8 12/12/23

## 2023-12-19 NOTE — Assessment & Plan Note (Signed)
 blood pressure is controlled, takes Atenolol, Enalapril, resumed HCTZ after stopped Jardiance.

## 2023-12-19 NOTE — Assessment & Plan Note (Signed)
 off Jardiance  12/23 caused weakness. Bun/creat 21/0.87 12/12/23

## 2023-12-19 NOTE — Patient Instructions (Addendum)
 1.Schedule Annual Wellness Visit-the patient desires to delay.  2. F/u in clinic [facility] 6 months.

## 2023-12-19 NOTE — Progress Notes (Unsigned)
 Location:   [facility]   Place of Service:    Provider: [facility] Lorisa Scheid NP  Jermar Colter X, NP  Patient Care Team: Egor Fullilove X, NP as PCP - General (Internal Medicine) Jabree Pernice X, NP as Nurse Practitioner (Internal Medicine)  Extended Emergency Contact Information Primary Emergency Contact: Phillips,Hal Address: [address]          [place], [place] [address] [place] Home Phone: [phone] Relation: Son  Code Status:  DNR Goals of care: Advanced Directive information    12/19/2023   12:59 PM  Advanced Directives  Does Patient Have a Medical Advance Directive? Yes  Type of Advance Directive Healthcare Power of Attorney  Does patient want to make changes to medical advance directive? No - Patient declined  Copy of Healthcare Power of Attorney in Chart? Yes - validated most recent copy scanned in chart (See row information)     Chief Complaint  Patient presents with   Medical Management of Chronic Issues    5 Month follow up.    HPI:  Pt is a 89 y.o. female seen today for medical management of chronic diseases.     HTN, blood pressure is controlled, takes Atenolol , Enalapril , resumed HCTZ after stopped Jardiance .              Hyponatremia, mild, Na 134 07/02/23<<136 12/12/23, hydrochlorothiazide  can be contributing.              CKD  off Jardiance  12/23 caused weakness. Bun/creat 21/0.87 12/12/23             CAD, Hx of heart attack, takes Lipitor, Enalapril , ASA             Hyperlipidemia, on Atorvastatin , LDL 41 12/12/23             T2DM, on Metformin ,  desires no routine ophthalmology, podiatrist visit. Hgb a1c 6.8 07/02/23=6.8 12/12/23             Pessary, followed by GYN, saw Urology in the past             Weight loss: stable, TSH 2.48,  Vit B12 329, Vit D 23 12/12/23             Vit D deficiency, Vit D 23 07/02/23 and 12/12/23, on Ca, Vit D             Hx of vasculitis RUE, treated with Methotrexate, no radial pulse R             Itching red papules on and off  for years, Triamcinolone  cream effective.     Past Medical History:  Diagnosis Date   Coronary arteriosclerosis    Diabetes mellitus without complication (HCC)    Type 2    Hyperlipidemia    Hypertension    Myocardial infarction [facility])    Past Surgical History:  Procedure Laterality Date   APPENDECTOMY  1988   Dr. Maude   CORONARY ARTERY BYPASS GRAFT  1990   EYE SURGERY  2012   Dr, Ashley   KYPHOPLASTY  2015   Dr. Renato    Allergies  Allergen Reactions   Bactrim  [Sulfamethoxazole -Trimethoprim ] Diarrhea   Shrimp [Shellfish Allergy]    Poison Ivy Extract Rash    Allergies as of 12/19/2023       Reactions   Bactrim  [sulfamethoxazole -trimethoprim ] Diarrhea   Shrimp [shellfish Allergy]    Poison Ivy Extract Rash        Medication List  Accurate as of December 19, 2023 11:59 PM. If you have any questions, ask your nurse or doctor.          atenolol  50 MG tablet Commonly known as: TENORMIN  Take 1 tablet (50 mg total) by mouth daily.   atorvastatin  20 MG tablet Commonly known as: LIPITOR Take 1 tablet (20 mg total) by mouth daily.   Calcium  Carbonate-Vitamin D3 600-400 MG-UNIT Tabs Take 600 mg by mouth 2 (two) times daily with a meal.   enalapril  20 MG tablet Commonly known as: VASOTEC  Take 1 tablet (20 mg total) by mouth 2 (two) times daily.   hydrochlorothiazide  25 MG tablet Commonly known as: HYDRODIURIL  Take 1 tablet (25 mg total) by mouth daily.   metFORMIN  500 MG tablet Commonly known as: GLUCOPHAGE  Take 1 tablet (500 mg total) by mouth 2 (two) times daily with a meal.   triamcinolone  cream 0.1 % Commonly known as: KENALOG  APPLY TO THE AFFECTED AREA(S) TWICE DAILY   VITAMIN D  (CHOLECALCIFEROL) PO Take by mouth daily.        Review of Systems  Constitutional:  Negative for activity change, fatigue and fever.  HENT:  Positive for hearing loss. Negative for congestion and voice change.   Eyes:  Negative for visual disturbance.   Respiratory:  Negative for shortness of breath.        Occasional hacking cough for 30 more years since started ACEI  Cardiovascular:  Negative for chest pain and leg swelling.  Gastrointestinal:  Negative for abdominal pain and constipation.  Genitourinary:  Negative for dysuria and urgency.       Pessary, nocturnal urination x1 average  Musculoskeletal:  Positive for arthralgias, back pain and gait problem.  Skin:  Negative for color change.       Dry skin, itches sometimes with random small red papules   Neurological:  Negative for speech difficulty, weakness and headaches.  Psychiatric/Behavioral:  Positive for sleep disturbance. Negative for behavioral problems. The patient is not nervous/anxious.        Sometimes stay awake after bathroom trips at night, but taking a nap during day.     Immunization History  Administered Date(s) Administered   Fluad Quad(high Dose 65+) 11/28/2020   INFLUENZA, HIGH DOSE SEASONAL PF 11/27/2016, 11/14/2017, 11/25/2019, 12/12/2022   Influenza, Seasonal, Injecte, Preservative Fre 12/27/2010, 12/21/2011   Influenza,inj,Quad PF,6+ Mos 01/14/2013, 12/01/2013, 01/25/2015   Influenza-Unspecified 11/12/2017, 11/28/2023   Moderna SARS-COV2 Booster Vaccination 11/28/2022   Moderna Sars-Covid-2 Vaccination 06/08/2019, 07/06/2019, 01/15/2020   Pfizer Covid-19 Vaccine Bivalent Booster 84yrs & up 11/01/2020   Pneumococcal Conjugate-13 07/27/2014   Pneumococcal Polysaccharide-23 11/07/2004   Tdap 02/20/2023   Pertinent  Health Maintenance Due  Topic Date Due   FOOT EXAM  05/17/2023   HEMOGLOBIN A1C  06/11/2024   DEXA SCAN  Completed   Influenza Vaccine  Discontinued   OPHTHALMOLOGY EXAM  Discontinued      12/07/2020    3:13 PM 05/17/2022    1:05 PM 06/07/2022    1:06 PM 09/20/2022   10:10 AM 02/21/2023    2:50 PM  Fall Risk  Falls in the past year? 0 0 0 0 1  Was there an injury with Fall? 0 0 0 0 1  Fall Risk Category Calculator 0 0 0 0 3  Fall Risk  Category (Retired) Low       (RETIRED) Patient Fall Risk Level Low fall risk       Patient at Risk for Falls Due to No Fall Risks  No Fall Risks No Fall Risks No Fall Risks   Fall risk Follow up  Falls evaluation completed Falls evaluation completed Falls evaluation completed      Data saved with a previous flowsheet row definition   Functional Status Survey:    Vitals:   12/19/23 1301  BP: 132/72  Pulse: 74  Resp: 18  Temp: 97.8 F (36.6 C)  SpO2: 96%  Weight: 136 lb 9.6 oz (62 kg)  Height: 5' 5 (1.651 m)   Body mass index is 22.73 kg/m. Physical Exam Vitals and nursing note reviewed.  Constitutional:      Appearance: Normal appearance.  HENT:     Head: Normocephalic and atraumatic.     Mouth/Throat:     Mouth: Mucous membranes are moist.  Eyes:     Extraocular Movements: Extraocular movements intact.     Conjunctiva/sclera: Conjunctivae normal.     Pupils: Pupils are equal, round, and reactive to light.  Cardiovascular:     Rate and Rhythm: Normal rate and regular rhythm.     Heart sounds: No murmur heard.    Comments: Right radial pulse not felt.  Pulmonary:     Breath sounds: No rhonchi or rales.  Abdominal:     General: Bowel sounds are normal.     Palpations: Abdomen is soft.     Tenderness: There is no abdominal tenderness.  Musculoskeletal:     Cervical back: Normal range of motion and neck supple.     Right lower leg: No edema.     Left lower leg: No edema.  Skin:    General: Skin is warm and dry.     Comments: On and off itching red papules, chronic, responded to topical steroid, represent papular eczema  Neurological:     General: No focal deficit present.     Mental Status: She is alert and oriented to person, place, and time. Mental status is at baseline.     Motor: No weakness.     Gait: Gait abnormal.     Comments: Resolved right arm/hand weakness, muscle strength 5/5, ambulates with walker.   Psychiatric:        Mood and Affect: Mood normal.         Behavior: Behavior normal.        Thought Content: Thought content normal.        Judgment: Judgment normal.     Labs reviewed: Recent Labs    07/02/23 0650 12/12/23 0646  NA 134* 136  K 4.0 4.0  CL 94* 98  CO2 30 31  GLUCOSE 132* 168*  BUN 17 21  CREATININE 0.83 0.87  CALCIUM  10.0 10.2   Recent Labs    07/02/23 0650 12/12/23 0646  AST 15 15  ALT 12 13  BILITOT 1.0 0.9  PROT 7.0 6.6   Recent Labs    07/02/23 0650 12/12/23 0646  WBC 7.6 7.4  NEUTROABS 4,773 5,424  HGB 14.7 15.3  HCT 44.3 46.4*  MCV 90.0 93.7  PLT 205 214   Lab Results  Component Value Date   TSH 2.48 12/12/2023   Lab Results  Component Value Date   HGBA1C 6.8 (H) 12/12/2023   Lab Results  Component Value Date   CHOL 120 12/12/2023   HDL 65 12/12/2023   LDLCALC 41 12/12/2023   TRIG 68 12/12/2023   CHOLHDL 1.8 12/12/2023    Significant Diagnostic Results in last 30 days:  No results found.  Assessment/Plan  HTN (hypertension)  blood pressure is  controlled, takes Atenolol , Enalapril , resumed HCTZ after stopped Jardiance   Hyponatremia mild, Na 134 07/02/23<<136 12/12/23, hydrochlorothiazide  can be contributing.   CKD (chronic kidney disease) stage 3, GFR 30-59 ml/min (HCC) off Jardiance  12/23 caused weakness. Bun/creat 21/0.87 12/12/23  Type 2 diabetes mellitus (HCC)  on Metformin ,  desires no routine ophthalmology, podiatrist visit. Hgb a1c 6.8 07/02/23=6.8 12/12/23  Vitamin D  deficiency Vit D 23 07/02/23 and 12/12/23, on Ca, Vit D May consider increase Vit D supplement(current Vit D400u)   Family/ staff Communication: plan of care reviewed with the patient  Labs/tests ordered:  labs one year

## 2023-12-19 NOTE — Assessment & Plan Note (Signed)
 Vit D 23 07/02/23 and 12/12/23, on Ca, Vit D May consider increase Vit D supplement(current Vit D400u)

## 2023-12-20 ENCOUNTER — Encounter: Payer: Self-pay | Admitting: Nurse Practitioner

## 2024-03-02 ENCOUNTER — Other Ambulatory Visit: Payer: Self-pay | Admitting: Nurse Practitioner

## 2024-03-02 DIAGNOSIS — E785 Hyperlipidemia, unspecified: Secondary | ICD-10-CM

## 2024-03-02 DIAGNOSIS — I10 Essential (primary) hypertension: Secondary | ICD-10-CM

## 2024-03-02 DIAGNOSIS — I252 Old myocardial infarction: Secondary | ICD-10-CM

## 2024-03-19 ENCOUNTER — Encounter: Payer: Self-pay | Admitting: Nurse Practitioner

## 2024-03-19 ENCOUNTER — Ambulatory Visit: Admitting: Nurse Practitioner

## 2024-03-19 VITALS — BP 122/76 | HR 72 | Temp 97.9°F | Resp 17 | Ht 65.0 in | Wt 138.0 lb

## 2024-03-19 DIAGNOSIS — Z0000 Encounter for general adult medical examination without abnormal findings: Secondary | ICD-10-CM

## 2024-03-19 NOTE — Progress Notes (Signed)
 "  Chief Complaint  Patient presents with   Medicare Wellness    Annual Wellness Visit.     Subjective:   Jessica Walters is a 89 y.o. female who presents for a Medicare Annual Wellness Visit @ clinic [facility]  Visit info / Clinical Intake: Medicare Wellness Visit Type:: Subsequent Annual Wellness Visit Persons participating in visit and providing information:: patient Medicare Wellness Visit Mode:: In-person (required for WTM) Interpreter Needed?: No Pre-visit prep was completed: yes AWV questionnaire completed by patient prior to visit?: no Living arrangements:: (!) lives alone Patient's Overall Health Status Rating: very good Typical amount of pain: none Does pain affect daily life?: no Are you currently prescribed opioids?: no  Dietary Habits and Nutritional Risks How many meals a day?: 3 Eats fruit and vegetables daily?: yes Most meals are obtained by: having others provide food In the last 2 weeks, have you had any of the following?: none Diabetic:: (!) yes Any non-healing wounds?: no How often do you check your BS?: 0 Would you like to be referred to a Nutritionist or for Diabetic Management? : no  Functional Status Activities of Daily Living (to include ambulation/medication): Independent Ambulation: Independent with device- listed below Home Assistive Devices/Equipment: Walker (specify Type) Medication Administration: Independent Home Management (perform basic housework or laundry): Independent Manage your own finances?: yes Primary transportation is: facility / other Concerns about vision?: no *vision screening is required for WTM*  Fall Screening Falls in the past year?: 0 Number of falls in past year: 0 Was there an injury with Fall?: 0 Fall Risk Category Calculator: 0 Patient Fall Risk Level: Low Fall Risk  Fall Risk Patient at Risk for Falls Due to: Impaired balance/gait Fall risk Follow up: Falls evaluation completed; Education provided; Falls  prevention discussed  Home and Transportation Safety: All rugs have non-skid backing?: yes All stairs or steps have railings?: yes Grab bars in the bathtub or shower?: yes Have non-skid surface in bathtub or shower?: yes Good home lighting?: yes Regular seat belt use?: yes  Cognitive Assessment Difficulty concentrating, remembering, or making decisions? : no Will 6CIT or Mini Cog be Completed: yes What year is it?: 0 points What month is it?: 0 points Give patient an address phrase to remember (5 components): [address] [facility] [place]  About what time is it?: 0 points Count backwards from 20 to 1: 0 points Say the months of the year in reverse: 0 points Repeat the address phrase from earlier: 2 points 6 CIT Score: 2 points  Advance Directives (For Healthcare) Does Patient Have a Medical Advance Directive?: Yes Does patient want to make changes to medical advance directive?: No - Patient declined Type of Advance Directive: Healthcare Power of Attorney Copy of Healthcare Power of Attorney in Chart?: Yes - validated most recent copy scanned in chart (See row information)  Reviewed/Updated  Reviewed/Updated: Reviewed All (Medical, Surgical, Family, Medications, Allergies, Care Teams, Patient Goals)    Allergies (verified) Bactrim  [sulfamethoxazole -trimethoprim ], Shrimp [shellfish allergy], and Poison ivy extract   Current Medications (verified) Outpatient Encounter Medications as of 03/19/2024  Medication Sig   ASPIRIN LOW DOSE PO Take 81 mg by mouth daily.   atenolol  (TENORMIN ) 50 MG tablet Take 1 tablet (50 mg total) by mouth daily.   atorvastatin  (LIPITOR) 20 MG tablet Take 1 tablet (20 mg total) by mouth daily.   Calcium  Carbonate-Vitamin D3 600-400 MG-UNIT TABS Take 600 mg by mouth 2 (two) times daily with a meal.   enalapril  (VASOTEC ) 20 MG tablet  Take 1 tablet (20 mg total) by mouth 2 (two) times daily.   hydrochlorothiazide  (HYDRODIURIL ) 25 MG tablet Take 1 tablet  (25 mg total) by mouth daily.   metFORMIN  (GLUCOPHAGE ) 500 MG tablet Take 1 tablet (500 mg total) by mouth 2 (two) times daily with a meal.   triamcinolone  cream (KENALOG ) 0.1 % APPLY TO THE AFFECTED AREA(S) TWICE DAILY (Patient taking differently: Apply topically as needed. to affected area(s))   VITAMIN D , CHOLECALCIFEROL, PO Take by mouth daily.   No facility-administered encounter medications on file as of 03/19/2024.    History: Past Medical History:  Diagnosis Date   Coronary arteriosclerosis    Diabetes mellitus without complication (HCC)    Type 2    Hyperlipidemia    Hypertension    Myocardial infarction [facility])    Past Surgical History:  Procedure Laterality Date   APPENDECTOMY  1988   Dr. Maude   CORONARY ARTERY BYPASS GRAFT  1990   EYE SURGERY  2012   Dr, Ashley   KYPHOPLASTY  2015   Dr. Renato   Family History  Problem Relation Age of Onset   Hypertension Mother    Lung cancer Father    Social History   Occupational History   Not on file  Tobacco Use   Smoking status: Never   Smokeless tobacco: Never  Vaping Use   Vaping status: Never Used  Substance and Sexual Activity   Alcohol use: Not Currently   Drug use: Never   Sexual activity: Not on file   Tobacco Counseling Counseling given: Not Answered  SDOH Screenings   Food Insecurity: No Food Insecurity (03/19/2024)  Housing: Unknown (03/19/2024)  Transportation Needs: No Transportation Needs (03/19/2024)  Utilities: Not At Risk (03/19/2024)  Depression (PHQ2-9): Low Risk (03/19/2024)  Physical Activity: Insufficiently Active (03/19/2024)  Social Connections: Socially Isolated (03/19/2024)  Stress: No Stress Concern Present (03/19/2024)  Tobacco Use: Low Risk (03/19/2024)   See flowsheets for full screening details  Depression Screen PHQ 2 & 9 Depression Scale- Over the past 2 weeks, how often have you been bothered by any of the following problems? Little interest or pleasure in doing things: 0 Feeling down,  depressed, or hopeless (PHQ Adolescent also includes...irritable): 0 PHQ-2 Total Score: 0     Goals Addressed   None          Objective:    Today's Vitals   03/19/24 1303  BP: 122/76  Pulse: 72  Resp: 17  Temp: 97.9 F (36.6 C)  SpO2: 96%  Weight: 138 lb (62.6 kg)  Height: 5' 5 (1.651 m)   Body mass index is 22.96 kg/m.  Hearing/Vision screen Hearing Screening - Comments:: No hearing issues. Vision Screening - Comments:: Eye exam 3 or 4 years ago. Some trouble with eye sight. Immunizations and Health Maintenance Health Maintenance  Topic Date Due   Zoster Vaccines- Shingrix (1 of 2) Never done   FOOT EXAM  05/17/2023   COVID-19 Vaccine (6 - 2025-26 season) 01/03/2025 (Originally 10/14/2023)   HEMOGLOBIN A1C  06/11/2024   Medicare Annual Wellness (AWV)  03/19/2025   Pneumococcal Vaccine: 50+ Years  Completed   Bone Density Scan  Completed   Meningococcal B Vaccine  Aged Out   DTaP/Tdap/Td  Discontinued   Influenza Vaccine  Discontinued   OPHTHALMOLOGY EXAM  Discontinued        Assessment/Plan:  This is a routine wellness examination for [place].  Patient Care Team: Vonya Ohalloran X, NP as PCP - General (Internal Medicine)  Dessirae Scarola X, NP as Nurse Practitioner (Internal Medicine)  I have personally reviewed and noted the following in the patients chart:   Medical and social history Use of alcohol, tobacco or illicit drugs  Current medications and supplements including opioid prescriptions. Functional ability and status Nutritional status Physical activity Advanced directives List of other physicians Hospitalizations, surgeries, and ER visits in previous 12 months Vitals Screenings to include cognitive, depression, and falls Referrals and appointments  No orders of the defined types were placed in this encounter.  In addition, I have reviewed and discussed with patient certain preventive protocols, quality metrics, and best practice recommendations. A  written personalized care plan for preventive services as well as general preventive health recommendations were provided to patient.   Gracin Soohoo X Rocklin Soderquist, NP   03/19/2024   Return in 1 year (on 03/19/2025).  After Visit Summary: (In Person-Printed) AVS printed and given to the patient   "

## 2024-06-18 ENCOUNTER — Encounter: Admitting: Nurse Practitioner
